# Patient Record
Sex: Female | Born: 1990 | Race: White | Hispanic: No | Marital: Married | Smoking: Never smoker
Health system: Southern US, Community
[De-identification: ages and names within clinical notes are randomized; demographics above are authoritative.]

## PROBLEM LIST (undated history)

## (undated) ENCOUNTER — Inpatient Hospital Stay (HOSPITAL_COMMUNITY): Payer: Self-pay

## (undated) HISTORY — PX: BREAST ENHANCEMENT SURGERY: SHX7

## (undated) HISTORY — PX: WISDOM TOOTH EXTRACTION: SHX21

## (undated) HISTORY — PX: CLAVICLE SURGERY: SHX598

---

## 2016-04-18 ENCOUNTER — Inpatient Hospital Stay (HOSPITAL_COMMUNITY)
Admission: AD | Admit: 2016-04-18 | Discharge: 2016-04-18 | Disposition: A | Discharge status: Home / Self Care | Payer: Medicaid Other | Source: Ambulatory Visit | Attending: Obstetrics and Gynecology | Admitting: Obstetrics and Gynecology

## 2016-04-18 ENCOUNTER — Inpatient Hospital Stay (HOSPITAL_COMMUNITY): Payer: Medicaid Other

## 2016-04-18 ENCOUNTER — Inpatient Hospital Stay (EMERGENCY_DEPARTMENT_HOSPITAL)
Admission: AD | Admit: 2016-04-18 | Discharge: 2016-04-18 | Disposition: A | Discharge status: Home / Self Care | Payer: Medicaid Other | Source: Ambulatory Visit | Attending: Obstetrics and Gynecology | Admitting: Obstetrics and Gynecology

## 2016-04-18 ENCOUNTER — Encounter (HOSPITAL_COMMUNITY): Payer: Self-pay | Admitting: *Deleted

## 2016-04-18 ENCOUNTER — Encounter (HOSPITAL_COMMUNITY): Payer: Self-pay

## 2016-04-18 DIAGNOSIS — O3680X0 Pregnancy with inconclusive fetal viability, not applicable or unspecified: Secondary | ICD-10-CM

## 2016-04-18 DIAGNOSIS — R1013 Epigastric pain: Secondary | ICD-10-CM | POA: Diagnosis present

## 2016-04-18 DIAGNOSIS — R109 Unspecified abdominal pain: Secondary | ICD-10-CM

## 2016-04-18 DIAGNOSIS — O26891 Other specified pregnancy related conditions, first trimester: Secondary | ICD-10-CM | POA: Insufficient documentation

## 2016-04-18 DIAGNOSIS — O209 Hemorrhage in early pregnancy, unspecified: Secondary | ICD-10-CM | POA: Insufficient documentation

## 2016-04-18 DIAGNOSIS — Z3A01 Less than 8 weeks gestation of pregnancy: Secondary | ICD-10-CM | POA: Insufficient documentation

## 2016-04-18 DIAGNOSIS — Z3490 Encounter for supervision of normal pregnancy, unspecified, unspecified trimester: Secondary | ICD-10-CM

## 2016-04-18 DIAGNOSIS — R6881 Early satiety: Secondary | ICD-10-CM | POA: Insufficient documentation

## 2016-04-18 DIAGNOSIS — O9989 Other specified diseases and conditions complicating pregnancy, childbirth and the puerperium: Secondary | ICD-10-CM

## 2016-04-18 DIAGNOSIS — O26899 Other specified pregnancy related conditions, unspecified trimester: Secondary | ICD-10-CM

## 2016-04-18 LAB — URINE MICROSCOPIC-ADD ON

## 2016-04-18 LAB — COMPREHENSIVE METABOLIC PANEL
ALT: 13 U/L — AB (ref 14–54)
AST: 18 U/L (ref 15–41)
Albumin: 4.5 g/dL (ref 3.5–5.0)
Alkaline Phosphatase: 112 U/L (ref 38–126)
Anion gap: 6 (ref 5–15)
BUN: 13 mg/dL (ref 6–20)
CHLORIDE: 105 mmol/L (ref 101–111)
CO2: 24 mmol/L (ref 22–32)
CREATININE: 0.55 mg/dL (ref 0.44–1.00)
Calcium: 9.5 mg/dL (ref 8.9–10.3)
GFR calc non Af Amer: >60 mL/min (ref >60)
Glucose, Bld: 86 mg/dL (ref 65–99)
Potassium: 4.5 mmol/L (ref 3.5–5.1)
SODIUM: 135 mmol/L (ref 135–145)
Total Bilirubin: 1 mg/dL (ref 0.3–1.2)
Total Protein: 7.5 g/dL (ref 6.5–8.1)

## 2016-04-18 LAB — URINALYSIS, ROUTINE W REFLEX MICROSCOPIC
Bilirubin Urine: NEGATIVE
Glucose, UA: NEGATIVE mg/dL
Ketones, ur: NEGATIVE mg/dL
LEUKOCYTES UA: NEGATIVE
NITRITE: NEGATIVE
PROTEIN: NEGATIVE mg/dL
Specific Gravity, Urine: 1.01 (ref 1.005–1.030)
pH: 5.5 (ref 5.0–8.0)

## 2016-04-18 LAB — WET PREP, GENITAL
Clue Cells Wet Prep HPF POC: NONE SEEN
Sperm: NONE SEEN
Trich, Wet Prep: NONE SEEN
YEAST WET PREP: NONE SEEN

## 2016-04-18 LAB — HCG, QUANTITATIVE, PREGNANCY: HCG, BETA CHAIN, QUANT, S: 267 m[IU]/mL — AB (ref <5)

## 2016-04-18 LAB — LIPASE, BLOOD: Lipase: 27 U/L (ref 11–51)

## 2016-04-18 LAB — CBC
HCT: 39 % (ref 36.0–46.0)
HEMOGLOBIN: 13.4 g/dL (ref 12.0–15.0)
MCH: 28.5 pg (ref 26.0–34.0)
MCHC: 34.4 g/dL (ref 30.0–36.0)
MCV: 82.8 fL (ref 78.0–100.0)
Platelets: 208 10*3/uL (ref 150–400)
RBC: 4.71 MIL/uL (ref 3.87–5.11)
RDW: 13.7 % (ref 11.5–15.5)
WBC: 12.4 10*3/uL — AB (ref 4.0–10.5)

## 2016-04-18 LAB — POCT PREGNANCY, URINE: PREG TEST UR: POSITIVE — AB

## 2016-04-18 MED ORDER — GI COCKTAIL ~~LOC~~
30.0000 mL | Freq: Once | ORAL | Status: AC
  Administered 2016-04-18: 30 mL via ORAL
  Filled 2016-04-18: qty 30

## 2016-04-18 MED ORDER — PANTOPRAZOLE SODIUM 40 MG PO TBEC
80.0000 mg | DELAYED_RELEASE_TABLET | Freq: Once | ORAL | Status: AC
  Administered 2016-04-18: 80 mg via ORAL
  Filled 2016-04-18: qty 2

## 2016-04-18 MED ORDER — OMEPRAZOLE 40 MG PO CPDR: 40.0000 mg | DELAYED_RELEASE_CAPSULE | Freq: Two times a day (BID) | ORAL | 0 refills | Status: DC

## 2016-04-18 MED ORDER — ACETAMINOPHEN 500 MG PO TABS
1000.0000 mg | ORAL_TABLET | Freq: Once | ORAL | Status: AC
  Administered 2016-04-18: 1000 mg via ORAL
  Filled 2016-04-18: qty 2

## 2016-04-18 MED ORDER — PROMETHAZINE HCL 25 MG PO TABS
25.0000 mg | ORAL_TABLET | Freq: Once | ORAL | Status: AC
  Administered 2016-04-18: 25 mg via ORAL
  Filled 2016-04-18: qty 1

## 2016-04-18 MED ORDER — FAMOTIDINE 20 MG PO TABS
20.0000 mg | ORAL_TABLET | Freq: Once | ORAL | Status: AC
  Administered 2016-04-18: 20 mg via ORAL
  Filled 2016-04-18: qty 1

## 2016-04-18 MED ORDER — SIMETHICONE 80 MG PO CHEW
80.0000 mg | CHEWABLE_TABLET | Freq: Once | ORAL | Status: AC
  Administered 2016-04-18: 80 mg via ORAL
  Filled 2016-04-18: qty 1

## 2016-04-18 NOTE — Discharge Instructions (Signed)

## 2016-04-18 NOTE — MAU Note (Addendum)
Patient has had positive UPT, onset of lower abdominal pain. Patient denies bleeding or vaginal discharge, does states she had 4 loose stools this morning, having nausea but no vomiting.

## 2016-04-18 NOTE — MAU Note (Signed)
Notified Dr. Omer JackMumaw on unit pain unrelieved by [facility].

## 2016-04-18 NOTE — Discharge Instructions (Signed)
Abdominal Pain During Pregnancy Abdominal pain is common in pregnancy. Most of the time, it does not cause harm. There are many causes of abdominal pain. Some causes are more serious than others. Some of the causes of abdominal pain in pregnancy are easily diagnosed. Occasionally, the diagnosis takes time to understand. Other times, the cause is not determined. Abdominal pain can be a sign that something is very wrong with the pregnancy, or the pain may have nothing to do with the pregnancy at all. For this reason, always tell your health care provider if you have any abdominal discomfort. HOME CARE INSTRUCTIONS  Monitor your abdominal pain for any changes. The following actions may help to alleviate any discomfort you are experiencing:  Do not have sexual intercourse or put anything in your vagina until your symptoms go away completely.  Get plenty of rest until your pain improves.  Drink clear fluids if you feel nauseous. Avoid solid food as long as you are uncomfortable or nauseous.  Only take over-the-counter or prescription medicine as directed by your health care provider.  Keep all follow-up appointments with your health care provider. SEEK IMMEDIATE MEDICAL CARE IF:  You are bleeding, leaking fluid, or passing tissue from the vagina.  You have increasing pain or cramping.  You have persistent vomiting.  You have painful or bloody urination.  You have a fever.  You notice a decrease in your baby's movements.  You have extreme weakness or feel faint.  You have shortness of breath, with or without abdominal pain.  You develop a severe headache with abdominal pain.  You have abnormal vaginal discharge with abdominal pain.  You have persistent diarrhea.  You have abdominal pain that continues even after rest, or gets worse. MAKE SURE YOU:   Understand these instructions.  Will watch your condition.  Will get help right away if you are not doing well or get worse.     This information is not intended to replace advice given to you by your health care provider. Make sure you discuss any questions you have with your health care provider.   Document Released: 06/09/2005 Document Revised: 03/30/2013 Document Reviewed: 01/06/2013 Elsevier Interactive Patient Education 2016 Elsevier Inc.     Gastroesophageal Reflux Disease, Adult Normally, food travels down the esophagus and stays in the stomach to be digested. However, when a person has gastroesophageal reflux disease (GERD), food and stomach acid move back up into the esophagus. When this happens, the esophagus becomes sore and inflamed. Over time, GERD can create small holes (ulcers) in the lining of the esophagus.  CAUSES This condition is caused by a problem with the muscle between the esophagus and the stomach (lower esophageal sphincter, or LES). Normally, the LES muscle closes after food passes through the esophagus to the stomach. When the LES is weakened or abnormal, it does not close properly, and that allows food and stomach acid to go back up into the esophagus. The LES can be weakened by certain dietary substances, medicines, and medical conditions, including:  Tobacco use.  Pregnancy.  Having a hiatal hernia.  Heavy alcohol use.  Certain foods and beverages, such as coffee, chocolate, onions, and peppermint. RISK FACTORS This condition is more likely to develop in:  People who have an increased body weight.  People who have connective tissue disorders.  People who use NSAID medicines. SYMPTOMS Symptoms of this condition include:  Heartburn.  Difficult or painful swallowing.  The feeling of having a lump in the throat.  Abitter taste in the mouth.  Bad breath.  Having a large amount of saliva.  Having an upset or bloated stomach.  Belching.  Chest pain.  Shortness of breath or wheezing.  Ongoing (chronic) cough or a night-time cough.  Wearing away of tooth  enamel.  Weight loss. Different conditions can cause chest pain. Make sure to see your health care provider if you experience chest pain. DIAGNOSIS Your health care provider will take a medical history and perform a physical exam. To determine if you have mild or severe GERD, your health care provider may also monitor how you respond to treatment. You may also have other tests, including:  An endoscopy toexamine your stomach and esophagus with a small camera.  A test thatmeasures the acidity level in your esophagus.  A test thatmeasures how much pressure is on your esophagus.  A barium swallow or modified barium swallow to show the shape, size, and functioning of your esophagus. TREATMENT The goal of treatment is to help relieve your symptoms and to prevent complications. Treatment for this condition may vary depending on how severe your symptoms are. Your health care provider may recommend:  Changes to your diet.  Medicine.  Surgery. HOME CARE INSTRUCTIONS Diet  Follow a diet as recommended by your health care provider. This may involve avoiding foods and drinks such as:  Coffee and tea (with or without caffeine).  Drinks that containalcohol.  Energy drinks and sports drinks.  Carbonated drinks or sodas.  Chocolate and cocoa.  Peppermint and mint flavorings.  Garlic and onions.  Horseradish.  Spicy and acidic foods, including peppers, chili powder, curry powder, vinegar, hot sauces, and barbecue sauce.  Citrus fruit juices and citrus fruits, such as oranges, lemons, and limes.  Tomato-based foods, such as red sauce, chili, salsa, and pizza with red sauce.  Fried and fatty foods, such as donuts, french fries, potato chips, and high-fat dressings.  High-fat meats, such as hot dogs and fatty cuts of red and white meats, such as rib eye steak, sausage, ham, and bacon.  High-fat dairy items, such as whole milk, butter, and cream cheese.  Eat small, frequent  meals instead of large meals.  Avoid drinking large amounts of liquid with your meals.  Avoid eating meals during the 2-3 hours before bedtime.  Avoid lying down right after you eat.  Do not exercise right after you eat. General Instructions  Pay attention to any changes in your symptoms.  Take over-the-counter and prescription medicines only as told by your health care provider. Do not take aspirin, ibuprofen, or other NSAIDs unless your health care provider told you to do so.  Do not use any tobacco products, including cigarettes, chewing tobacco, and e-cigarettes. If you need help quitting, ask your health care provider.  Wear loose-fitting clothing. Do not wear anything tight around your waist that causes pressure on your abdomen.  Raise (elevate) the head of your bed 6 inches (15cm).  Try to reduce your stress, such as with yoga or meditation. If you need help reducing stress, ask your health care provider.  If you are overweight, reduce your weight to an amount that is healthy for you. Ask your health care provider for guidance about a safe weight loss goal.  Keep all follow-up visits as told by your health care provider. This is important. SEEK MEDICAL CARE IF:  You have new symptoms.  You have unexplained weight loss.  You have difficulty swallowing, or it hurts to swallow.  You have  wheezing or a persistent cough.  Your symptoms do not improve with treatment.  You have a hoarse voice. SEEK IMMEDIATE MEDICAL CARE IF:  You have pain in your arms, neck, jaw, teeth, or back.  You feel sweaty, dizzy, or light-headed.  You have chest pain or shortness of breath.  You vomit and your vomit looks like blood or coffee grounds.  You faint.  Your stool is bloody or black.  You cannot swallow, drink, or eat.   This information is not intended to replace advice given to you by your health care provider. Make sure you discuss any questions you have with your health  care provider.   Document Released: 03/19/2005 Document Revised: 02/28/2015 Document Reviewed: 10/04/2014 Elsevier Interactive Patient Education [employer]2016 Elsevier Inc.

## 2016-04-18 NOTE — MAU Note (Signed)
Was seen here this am for upper abd pain which I think was probably gas. This afternoon started seeing pink vag d/c and having some lower abd cramping. Did see dime-sized something in bottom of toilet that was red on one occ.

## 2016-04-18 NOTE — MAU Provider Note (Signed)
Chief Complaint: Abdominal Pain and Vaginal Bleeding   First Provider Initiated Contact with Patient 04/19/2016 at 2130.  SUBJECTIVE HPI: Toni Brown is a 25 y.o. G3P1001 at [redacted]w[redacted]d by LMP who presents to maternity admissions reporting vaginal spotting and mild lower abdominal cramping in early pregnancy. She was seen in MAU earlier today for epigastric pain which resolved after she left MAU. She reports she ate food and passed some gas and felt much better so believes the pain earlier was gas pain.  She took a nap this afternoon and when she woke up and went to the bathroom, she saw some small clots in the toilet and pink when she wiped. She has noticed mild menstrual-like cramping that is associated with the bleeding.  She has not tried any treatments, nothing makes the symptom better or worse. It is new, as she has not had any bleeding in the pregnancy so far.   She denies vaginal itching/burning, urinary symptoms, h/a, dizziness, n/v, or fever/chills.     HPI  History reviewed. No pertinent past medical history. Past Surgical History:  Procedure Laterality Date  . BREAST ENHANCEMENT SURGERY    . CLAVICLE SURGERY    . WISDOM TOOTH EXTRACTION     Social History   Social History  . Marital status: Married    Spouse name: N/A  . Number of children: N/A  . Years of education: N/A   Occupational History  . Not on file.   Social History Main Topics  . Smoking status: Never Smoker  . Smokeless tobacco: Never Used  . Alcohol use No  . Drug use: No  . Sexual activity: Yes   Other Topics Concern  . Not on file   Social History Narrative  . No narrative on file   No current facility-administered medications on file prior to encounter.    Current Outpatient Prescriptions on File Prior to Encounter  Medication Sig Dispense Refill  . omeprazole (PRILOSEC) 40 MG capsule Take 1 capsule (40 mg total) by mouth 2 (two) times daily. 30 capsule 0  . Prenatal Vit-Fe Fumarate-FA (PRENATAL  MULTIVITAMIN) TABS tablet Take 1 tablet by mouth daily.      No Known Allergies  ROS:  Review of Systems  Constitutional: Negative for chills, fatigue and fever.  Respiratory: Negative for shortness of breath.   Cardiovascular: Negative for chest pain.  Gastrointestinal: Positive for nausea. Negative for vomiting.  Genitourinary: Positive for pelvic pain and vaginal bleeding. Negative for difficulty urinating, dysuria, flank pain, vaginal discharge and vaginal pain.  Neurological: Negative for dizziness and headaches.  Psychiatric/Behavioral: Negative.      I have reviewed patient's Past Medical Hx, Surgical Hx, Family Hx, Social Hx, medications and allergies.   Physical Exam   Patient Vitals for the past 24 hrs:  BP Temp Pulse Resp Height Weight  04/18/16 2247 110/64 98.1 F (36.7 C) 83 18 - -  04/18/16 1950 103/71 98.5 F (36.9 C) 90 18 5\' 10"  (1.778 m) 68.9 kg (151 lb 12.8 oz)   Constitutional: Well-developed, well-nourished female in no acute distress.  Cardiovascular: normal rate Respiratory: normal effort GI: Abd soft, non-tender. Pos BS x 4 MS: Extremities nontender, no edema, normal ROM Neurologic: Alert and oriented x 4.  GU: Neg CVAT.  PELVIC EXAM: Cervix pink, visually closed, without lesion, white creamy discharge with minimal blood, vaginal walls and external genitalia normal Bimanual exam: Cervix 0/long/high, firm, anterior, neg CMT, uterus nontender, nonenlarged, adnexa without tenderness, enlargement, or mass  LAB RESULTS Results  for orders placed or performed during the hospital encounter of 04/18/16 (from the past 24 hour(s))  ABO/Rh     Status: None (Preliminary result)   Collection Time: 04/18/16  8:48 PM  Result Value Ref Range   ABO/RH(D) O NEG   Wet prep, genital     Status: Abnormal   Collection Time: 04/18/16 10:40 PM  Result Value Ref Range   Yeast Wet Prep HPF POC NONE SEEN NONE SEEN   Trich, Wet Prep NONE SEEN NONE SEEN   Clue Cells Wet  Prep HPF POC NONE SEEN NONE SEEN   WBC, Wet Prep HPF POC MODERATE (A) NONE SEEN   Sperm NONE SEEN     --/--/O NEG (10/27 2048)  IMAGING [place] Ob Comp Less 14 Wks  Result Date: 04/18/2016 CLINICAL DATA:  Epigastric painSpotting with abdominal pain EXAM: OBSTETRIC <14 WK US AND TRANSVAGINAL OB US TECHNIQUE: Both transabdominal and transvaginal ultrasound examinations were performed for complete evaluation of the gestation as well as the maternal uterus, adnexal regions, and pelvic cul-de-sac. Transvaginal technique was performed to assess early pregnancy. COMPARISON:  None. FINDINGS: Intrauterine gestational sac: A small anechoic fluid collection is visualized within the thickened endometrium, this likely represents a small gestational sac. Yolk sac:  Not visualized Embryo:  Not visualized Cardiac Activity: Not visualized MSD: 3.6  mm   5 w   0  d Subchorionic hemorrhage:  None visualized. Maternal uterus/adnexae: The uterus is grossly unremarkable. Bilateral ovaries are normal in echotexture. The right ovary measures 1.9 x 2.8 x 1.9 cm. The left ovary measures 1.6 x 2.9 x 1.4 cm. No free fluid. IMPRESSION: Endometrial thickening with small anechoic fluid collection in the thickened endometrium. Probable early intrauterine gestational sac, but no yolk sac, fetal pole, or cardiac activity yet visualized. Recommend follow-up quantitative B-HCG levels and follow-up US in 14 days to confirm and assess viability. This recommendation follows SRU consensus guidelines: Diagnostic Criteria for Nonviable Pregnancy Early in the First Trimester. Malva Limes Engl J Med 2013; 161:0960-45; 369:1443-51. Electronically Signed   By: Jasmine PangKim  Fujinaga M.D.   On: 04/18/2016 21:25   [place] Ob Transvaginal  Result Date: 04/18/2016 CLINICAL DATA:  Epigastric painSpotting with abdominal pain EXAM: OBSTETRIC <14 WK US AND TRANSVAGINAL OB US TECHNIQUE: Both transabdominal and transvaginal ultrasound examinations were performed for complete evaluation of the  gestation as well as the maternal uterus, adnexal regions, and pelvic cul-de-sac. Transvaginal technique was performed to assess early pregnancy. COMPARISON:  None. FINDINGS: Intrauterine gestational sac: A small anechoic fluid collection is visualized within the thickened endometrium, this likely represents a small gestational sac. Yolk sac:  Not visualized Embryo:  Not visualized Cardiac Activity: Not visualized MSD: 3.6  mm   5 w   0  d Subchorionic hemorrhage:  None visualized. Maternal uterus/adnexae: The uterus is grossly unremarkable. Bilateral ovaries are normal in echotexture. The right ovary measures 1.9 x 2.8 x 1.9 cm. The left ovary measures 1.6 x 2.9 x 1.4 cm. No free fluid. IMPRESSION: Endometrial thickening with small anechoic fluid collection in the thickened endometrium. Probable early intrauterine gestational sac, but no yolk sac, fetal pole, or cardiac activity yet visualized. Recommend follow-up quantitative B-HCG levels and follow-up US in 14 days to confirm and assess viability. This recommendation follows SRU consensus guidelines: Diagnostic Criteria for Nonviable Pregnancy Early in the First Trimester. Malva Limes Engl J Med 2013; 409:8119-14; 369:1443-51. Electronically Signed   By: Jasmine PangKim  Fujinaga M.D.   On: 04/18/2016 21:25   [place] Abdomen Limited Ruq  Result  Date: 04/18/2016 CLINICAL DATA:  Epigastric pain. Patient is pregnant. LMP 03/07/2016. 6 weeks 3 days. EXAM: US ABDOMEN LIMITED - RIGHT UPPER QUADRANT COMPARISON:  None FINDINGS: Gallbladder: Insert gallbladder 1.1 mm Common bile duct: Diameter: 2.7 mm Liver: No focal lesion identified. Within normal limits in parenchymal echogenicity. IMPRESSION: Normal evaluation of the right upper quadrant. Electronically Signed   By: Norva Pavlov M.D.   On: 04/18/2016 15:26    MAU Management/MDM: Ordered labs and reviewed results. Pt stable at time of discharge.  ASSESSMENT 1. Pregnancy of unknown anatomic location   2. Vaginal bleeding in pregnancy, first  trimester     PLAN Discharge home Follow up in 2 days for repeat quant hcg Return precautions given Patient verbalized agreement and understanding with plan   Medication List    TAKE these medications   omeprazole 40 MG capsule Commonly known as:  PRILOSEC Take 1 capsule (40 mg total) by mouth 2 (two) times daily.   prenatal multivitamin Tabs tablet Take 1 tablet by mouth daily.      Follow-up Information    [facility]  MATERNITY ADMISSIONS Follow up on 04/20/2016.   Contact information: [address] [id number] mc [place] [address] [phone]         Dolores Patty, DO PGY-1, [facility] 04/18/2016 11:20 PM  I was present for the exam and agree with above. Ectopic precautions.   [place], CNM 04/19/2016 8:46 AM

## 2016-04-18 NOTE — MAU Provider Note (Signed)
Chief Complaint: Abdominal Pain   SUBJECTIVE HPI: Toni Brown is a 25 y.o. G3P1001 at [redacted]w[redacted]d who presents to Maternity Admissions reporting epigastric pain.  Patient states pain started today. Located in the epigastric region, does not radiate anywhere. Sharp, burning. Some nausea, no vomiting. Able to eat breakfast this AM. Never had this before, rates it at a 6/10. Hurts to bend over or twist. Nothing makes it better, has not tried any medications. Previous pregnancy had "terrible heartburn".  Had a few loose stools this morning, no hematochezia or melena. Denies F/C, CP/SOB. Denies pelvic pain, VB.    No past medical history on file. OB History  Gravida Para Term Preterm AB Living  3 1 1     1   SAB TAB Ectopic Multiple Live Births          1    # Outcome Date GA Lbr Len/2nd Weight Sex Delivery Anes PTL Lv  3 Current           2 Gravida           1 Term              Past Surgical History:  Procedure Laterality Date  . BREAST ENHANCEMENT SURGERY    . CLAVICLE SURGERY    . WISDOM TOOTH EXTRACTION     Social History   Social History  . Marital status: Married    Spouse name: N/A  . Number of children: N/A  . Years of education: N/A   Occupational History  . Not on file.   Social History Main Topics  . Smoking status: Never Smoker  . Smokeless tobacco: Never Used  . Alcohol use No  . Drug use: No  . Sexual activity: Not on file   Other Topics Concern  . Not on file   Social History Narrative  . No narrative on file   No current facility-administered medications on file prior to encounter.    No current outpatient prescriptions on file prior to encounter.   Allergies not on file  I have reviewed the past Medical Hx, Surgical Hx, Social Hx, Allergies and Medications.   REVIEW OF SYSTEMS  RESPIRATORY: no cough, shortness of breath, or wheezing CARDIOVASCULAR: no chest pain or dyspnea on exertion GASTROINTESTINAL: +epigastric abdominal pain, no change in  bowel habits, or black or bloody stools GENITO-URINARY: no dysuria, trouble voiding, or hematuria negative for - genital discharge, vulvar/vaginal symptoms or vaginal bleeding MUSKULOSKELETAL: negative for - gait disturbance or swelling in ankle - bilateral, foot - bilateral and leg - bilateral NEUROLOGICAL: negative for - dizziness, gait disturbance, headaches, numbness/tingling or visual changes OBSTETRICAL: No vaginal bleeding, no leaking fluid. No vaginal discharge.  OBJECTIVE Patient Vitals for the past 24 hrs:  BP Temp Temp src Pulse Resp Height Weight  04/18/16 1001 122/76 98.1 F (36.7 C) Oral 80 16 - -  04/18/16 0954 - - - - - 5\' 10"  (1.778 m) 153 lb (69.4 kg)    PHYSICAL EXAM Constitutional: Well-developed, well-nourished female in no acute distress.  Cardiovascular: normal rate, rhythm. No murmurs Respiratory: normal rate and effort, CTAB. GI: Abd soft, tender to epigastric region on palpation, gravid appropriate for gestational age. Pos BS x 4 MS: Extremities nontender, no edema, normal ROM Neurologic: Alert and oriented x 4.  GU: Neg CVAT.    LAB RESULTS Results for orders placed or performed during the hospital encounter of 04/18/16 (from the past 24 hour(s))  Urinalysis, Routine w reflex microscopic (not at [facility])  Status: Abnormal   Collection Time: 04/18/16  9:52 AM  Result Value Ref Range   Color, Urine YELLOW YELLOW   APPearance CLEAR CLEAR   Specific Gravity, Urine 1.010 1.005 - 1.030   pH 5.5 5.0 - 8.0   Glucose, UA NEGATIVE NEGATIVE mg/dL   Hgb urine dipstick TRACE (A) NEGATIVE   Bilirubin Urine NEGATIVE NEGATIVE   Ketones, ur NEGATIVE NEGATIVE mg/dL   Protein, ur NEGATIVE NEGATIVE mg/dL   Nitrite NEGATIVE NEGATIVE   Leukocytes, UA NEGATIVE NEGATIVE  Urine microscopic-add on     Status: Abnormal   Collection Time: 04/18/16  9:52 AM  Result Value Ref Range   Squamous Epithelial / LPF 0-5 (A) NONE SEEN   WBC, UA 0-5 0 - 5 WBC/hpf   RBC / HPF 0-5 0 -  5 RBC/hpf   Bacteria, UA FEW (A) NONE SEEN  Pregnancy, urine POC     Status: Abnormal   Collection Time: 04/18/16 10:00 AM  Result Value Ref Range   Preg Test, Ur POSITIVE (A) NEGATIVE    IMAGING No results found.  MAU COURSE GI cocktail - no relief H2 blocker, PPI Labs- WNL  [facility] Dr. Clayborne DanaMesner, who agrees with workup and if no improvement in symptoms and workup is negative, OK to d/c home and if worsens to [facility].    MDM Plan of care reviewed with patient, including labs and tests ordered and medical treatment.   ASSESSMENT 1. Abdominal pain, epigastric   2. Abdominal pain affecting pregnancy   3. Epigastric pain   4. Early stage of pregnancy     PLAN Discharge home in stable condition. PPI to be taken BID for the next few days. Abdominal pain and bleeding precautions given.      Medication List    STOP taking these medications   ibuprofen 200 MG tablet Commonly known as:  ADVIL,MOTRIN     TAKE these medications   omeprazole 40 MG capsule Commonly known as:  PRILOSEC Take 1 capsule (40 mg total) by mouth 2 (two) times daily.   prenatal multivitamin Tabs tablet Take 1 tablet by mouth daily.        [address] [place], [place] 04/18/2016  10:41 AM

## 2016-04-19 ENCOUNTER — Encounter (HOSPITAL_COMMUNITY): Payer: Self-pay | Admitting: Certified Nurse Midwife

## 2016-04-19 ENCOUNTER — Inpatient Hospital Stay (HOSPITAL_COMMUNITY)
Admission: AD | Admit: 2016-04-19 | Discharge: 2016-04-19 | Disposition: A | Discharge status: Home / Self Care | Payer: Managed Care, Other (non HMO) | Source: Ambulatory Visit | Attending: Obstetrics & Gynecology | Admitting: Obstetrics & Gynecology

## 2016-04-19 ENCOUNTER — Inpatient Hospital Stay (HOSPITAL_COMMUNITY): Payer: Managed Care, Other (non HMO)

## 2016-04-19 DIAGNOSIS — Z6741 Type O blood, Rh negative: Secondary | ICD-10-CM | POA: Diagnosis not present

## 2016-04-19 DIAGNOSIS — O039 Complete or unspecified spontaneous abortion without complication: Secondary | ICD-10-CM

## 2016-04-19 DIAGNOSIS — N939 Abnormal uterine and vaginal bleeding, unspecified: Secondary | ICD-10-CM | POA: Diagnosis present

## 2016-04-19 DIAGNOSIS — O209 Hemorrhage in early pregnancy, unspecified: Secondary | ICD-10-CM

## 2016-04-19 LAB — HCG, QUANTITATIVE, PREGNANCY: HCG, BETA CHAIN, QUANT, S: 164 m[IU]/mL — AB (ref <5)

## 2016-04-19 LAB — ABO/RH: ABO/RH(D): O NEG

## 2016-04-19 MED ORDER — RHO D IMMUNE GLOBULIN 1500 UNIT/2ML IJ SOSY
300.0000 ug | PREFILLED_SYRINGE | Freq: Once | INTRAMUSCULAR | Status: AC
  Administered 2016-04-19: 300 ug via INTRAMUSCULAR
  Filled 2016-04-19: qty 2

## 2016-04-19 NOTE — MAU Provider Note (Signed)
History     CSN: [id number]  Arrival date and time: 04/19/16 [id number]   First Provider Initiated Contact with Patient 04/19/16 1008      Chief Complaint  Patient presents with  . Vaginal Bleeding   HPI Toni Brown 25 y.o. [id number]  Comes to MAU with worsening cramping and heavier bleeding than at her visit on yesterday.  Chart and previous notes reviewed.  Also passed some tissue at home and strongly suspects this has been a miscarriage.  Showed provider picture she had on her phone of the tissue she passed.  Has had a miscarriage previously and thinks this is a miscarriage also.    OB History    Gravida Para Term Preterm AB Living   3 1 1   1 1    SAB TAB Ectopic Multiple Live Births   1       1      History reviewed. No pertinent past medical history.  Past Surgical History:  Procedure Laterality Date  . BREAST ENHANCEMENT SURGERY    . CLAVICLE SURGERY    . WISDOM TOOTH EXTRACTION      History reviewed. No pertinent family history.  Social History  Substance Use Topics  . Smoking status: Never Smoker  . Smokeless tobacco: Never Used  . Alcohol use No    Allergies: No Known Allergies  Prescriptions Prior to Admission  Medication Sig Dispense Refill Last Dose  . omeprazole (PRILOSEC) 40 MG capsule Take 1 capsule (40 mg total) by mouth 2 (two) times daily. 30 capsule 0 04/18/2016  . Prenatal Vit-Fe Fumarate-FA (PRENATAL MULTIVITAMIN) TABS tablet Take 1 tablet by mouth daily.    04/18/2016    Review of Systems  Constitutional: Negative for fever.  Gastrointestinal: Positive for abdominal pain.  Genitourinary:       No vaginal discharge. Heavier vaginal bleeding and passing of tissue. No dysuria.   Physical Exam   Blood pressure 113/74, pulse 82, temperature 98.5 F (36.9 C), temperature source Oral, resp. rate 18, last menstrual period 03/07/2016.  Physical Exam  Nursing note and vitals reviewed. Constitutional: She is oriented to person, place, and time. She  appears well-developed and well-nourished.  HENT:  Head: Normocephalic.  Eyes: EOM are normal.  Neck: Neck supple.  GI: Soft. There is no tenderness.  Genitourinary:  Genitourinary Comments: Speculum exam: Vagina - Moderate amount of dark blood and clots Cervix - Active bleeding noted and clot in cervix - removed with forceps and tolerated very well by client. Bimanual exam: Cervix slightly open - less than 1 cm. Uterus non tender, normal size Adnexa non tender, no masses bilaterally Chaperone present for exam.   Musculoskeletal: Normal range of motion.  Neurological: She is alert and oriented to person, place, and time.  Skin: Skin is warm and dry.  Psychiatric: She has a normal mood and affect.    MAU Course  Procedures Results for orders placed or performed during the hospital encounter of 04/19/16 (from the past 24 hour(s))  hCG, quantitative, pregnancy     Status: Abnormal   Collection Time: 04/19/16 10:27 AM  Result Value Ref Range   hCG, Beta Chain, Quant, S 164 (H) <5 mIU/mL  Rh IG workup (includes ABO/Rh)     Status: None (Preliminary result)   Collection Time: 04/19/16 10:27 AM  Result Value Ref Range   Gestational Age(Wks) 6    ABO/RH(D) O NEG    Antibody Screen NEG    Fetal Screen NOT NEEDED    Unit  Number [id number]    Blood Component Type RHIG    Unit division 00    Status of Unit ISSUED    Transfusion Status OK TO TRANSFUSE    CLINICAL DATA:  25 year old pregnant female presents with heavy bleeding. Tiny intrauterine sac-like structure was seen on obstetric scan from 1 day prior. Quantitative beta HCG 267 one day prior.  EDC by LMP: 11/29/2016, projecting to an expected gestational age of [redacted] weeks 0 days  EXAM: TRANSVAGINAL OB ULTRASOUND  TECHNIQUE: Transvaginal ultrasound was performed for complete evaluation of the gestation as well as the maternal uterus, adnexal regions, and pelvic cul-de-sac.  COMPARISON:  04/18/2016 obstetric  scan.  FINDINGS: Uterus is anteverted and normal in size and configuration. No uterine fibroids or other myometrial abnormalities. Bilayer endometrial thickness 10 mm. No intrauterine gestational sac is demonstrated. The previously demonstrated 5 x 2 x 4 mm sac-like structure in the fundal endometrium on the 04/18/2016 scan is absent on today's scan. Endometrium is mildly heterogeneous with no focal endometrial mass demonstrated.  Right ovary measures 3.3 x 1.9 x 2.3 cm. Left ovary measures 2.5 x 1.2 x 1.9 cm. No suspicious ovarian or adnexal masses. No abnormal free fluid in the pelvis.  IMPRESSION: No intrauterine gestational sac. Previously demonstrated 5 x 2 x 4 mm sac-like structure in the fundal endometrium on the 04/18/2016 scan is absent on this scan. Mildly heterogeneous endometrium measuring 10 mm bilayer thickness. No suspicious ovarian or adnexal masses. Findings are most consistent with a spontaneous abortion in progress. Given that a definitive pregnancy has not been visualized by sonography, serial serum beta HCG level monitoring is recommended, and a follow-up obstetric scan could be obtained if clinically warranted.   MDM Discussed Rhogam with client and she does want this injection.    Assessment and Plan  Miscarriage with O negative blood type  Plan Scheduled an appointment for follow up for BHCG in the clinic on Wednesday, Nov. 8 at 11 am.  Client made note of the appointment.   Rhogam ordered and given in MAU. Advised pelvic rest - no sex, no douching, no tampons.  Ashleynicole Mcclees L Geanie Pacifico 04/19/2016, 10:19 AM

## 2016-04-19 NOTE — Discharge Instructions (Signed)
Keep your appointment in the Clinic at [facility] on Wednesday, November 8 at 11 am for blood work.  Expect to be at the clinic for 2 hours. Can take Ibuprofen by the package directions if needed for pain.

## 2016-04-19 NOTE — MAU Note (Signed)
Pt states she started bleeding heavily this AM and passed what she believes is the POC. Pt came this AM to "be checked out". Pt states she is having increased cramping.

## 2016-04-20 LAB — RH IG WORKUP (INCLUDES ABO/RH)
ABO/RH(D): O NEG
ANTIBODY SCREEN: NEGATIVE
Gestational Age(Wks): 6
UNIT DIVISION: 0

## 2016-04-21 LAB — GC/CHLAMYDIA PROBE AMP (~~LOC~~) NOT AT ARMC
CHLAMYDIA, DNA PROBE: NEGATIVE
NEISSERIA GONORRHEA: NEGATIVE

## 2016-04-30 ENCOUNTER — Telehealth: Payer: Self-pay | Admitting: General Practice

## 2016-04-30 ENCOUNTER — Encounter: Payer: Self-pay | Admitting: General Practice

## 2016-04-30 ENCOUNTER — Ambulatory Visit: Payer: Medicaid Other

## 2016-04-30 NOTE — Telephone Encounter (Signed)
Patient missed lab appt for today. Called patient, no answer- left message stating we are trying to reach you because you missed your lab appt with us today. Please give the front office staff a call so we can reschedule this appt. Will send letter

## 2017-01-31 ENCOUNTER — Inpatient Hospital Stay (HOSPITAL_COMMUNITY)
Admission: AD | Admit: 2017-01-31 | Discharge: 2017-01-31 | Disposition: A | Discharge status: Home / Self Care | Payer: Medicaid Other | Source: Ambulatory Visit | Attending: Family Medicine | Admitting: Family Medicine

## 2017-01-31 ENCOUNTER — Encounter (HOSPITAL_COMMUNITY): Payer: Self-pay | Admitting: *Deleted

## 2017-01-31 DIAGNOSIS — O9989 Other specified diseases and conditions complicating pregnancy, childbirth and the puerperium: Secondary | ICD-10-CM | POA: Insufficient documentation

## 2017-01-31 DIAGNOSIS — R197 Diarrhea, unspecified: Secondary | ICD-10-CM | POA: Diagnosis not present

## 2017-01-31 DIAGNOSIS — O219 Vomiting of pregnancy, unspecified: Secondary | ICD-10-CM | POA: Diagnosis not present

## 2017-01-31 DIAGNOSIS — R112 Nausea with vomiting, unspecified: Secondary | ICD-10-CM | POA: Diagnosis present

## 2017-01-31 DIAGNOSIS — Z3A10 10 weeks gestation of pregnancy: Secondary | ICD-10-CM | POA: Diagnosis not present

## 2017-01-31 LAB — URINALYSIS, ROUTINE W REFLEX MICROSCOPIC
BILIRUBIN URINE: NEGATIVE
Glucose, UA: NEGATIVE mg/dL
KETONES UR: NEGATIVE mg/dL
NITRITE: NEGATIVE
PROTEIN: NEGATIVE mg/dL
Specific Gravity, Urine: 1.014 (ref 1.005–1.030)
pH: 6 (ref 5.0–8.0)

## 2017-01-31 LAB — POCT PREGNANCY, URINE: PREG TEST UR: POSITIVE — AB

## 2017-01-31 MED ORDER — PROMETHAZINE HCL 25 MG PO TABS: 25.0000 mg | ORAL_TABLET | Freq: Four times a day (QID) | ORAL | 0 refills | Status: AC | PRN

## 2017-01-31 MED ORDER — PROMETHAZINE HCL 25 MG PO TABS
25.0000 mg | ORAL_TABLET | Freq: Four times a day (QID) | ORAL | Status: DC | PRN
Start: 2017-01-31 — End: 2017-01-31
  Administered 2017-01-31: 25 mg via ORAL
  Filled 2017-01-31: qty 1

## 2017-01-31 NOTE — MAU Note (Signed)
Has been really really sick with this preg.  Thinks she is getting dehydrated. Has not been seen or had confirmation yet.

## 2017-01-31 NOTE — Discharge Instructions (Signed)
Morning Sickness °Morning sickness is when you feel sick to your stomach (nauseous) during pregnancy. This nauseous feeling may or may not come with vomiting. It often occurs in the morning but can be a problem any time of day. Morning sickness is most common during the first trimester, but it may continue throughout pregnancy. While morning sickness is unpleasant, it is usually harmless unless you develop severe and continual vomiting (hyperemesis gravidarum). This condition requires more intense treatment. °What are the causes? °The cause of morning sickness is not completely known but seems to be related to normal hormonal changes that occur in pregnancy. °What increases the risk? °You are at greater risk if you: °· Experienced nausea or vomiting before your pregnancy. °· Had morning sickness during a previous pregnancy. °· Are pregnant with more than one baby, such as twins. ° °How is this treated? °Do not use any medicines (prescription, over-the-counter, or herbal) for morning sickness without first talking to your health care provider. Your health care provider may prescribe or recommend: °· Vitamin B6 supplements. °· Anti-nausea medicines. °· The herbal medicine ginger. ° °Follow these instructions at home: °· Only take over-the-counter or prescription medicines as directed by your health care provider. °· Taking multivitamins before getting pregnant can prevent or decrease the severity of morning sickness in most women. °· Eat a piece of dry toast or unsalted crackers before getting out of bed in the morning. °· Eat five or six small meals a day. °· Eat dry and bland foods (rice, baked potato). Foods high in carbohydrates are often helpful. °· Do not drink liquids with your meals. Drink liquids between meals. °· Avoid greasy, fatty, and spicy foods. °· Get someone to cook for you if the smell of any food causes nausea and vomiting. °· If you feel nauseous after taking prenatal vitamins, take the vitamins at  night or with a snack. °· Snack on protein foods (nuts, yogurt, cheese) between meals if you are hungry. °· Eat unsweetened gelatins for desserts. °· Wearing an acupressure wristband (worn for sea sickness) may be helpful. °· Acupuncture may be helpful. °· Do not smoke. °· Get a humidifier to keep the air in your house free of odors. °· Get plenty of fresh air. °Contact a health care provider if: °· Your home remedies are not working, and you need medicine. °· You feel dizzy or lightheaded. °· You are losing weight. °Get help right away if: °· You have persistent and uncontrolled nausea and vomiting. °· You pass out (faint). °This information is not intended to replace advice given to you by your health care provider. Make sure you discuss any questions you have with your health care provider. °Document Released: 07/31/2006 Document Revised: 11/15/2015 Document Reviewed: 11/24/2012 °Elsevier Interactive Patient Education © 2017 [employer] ° °

## 2017-01-31 NOTE — MAU Provider Note (Signed)
History     CSN: [id number]  Arrival date and time: 01/31/17 4098  First Provider Initiated Contact with Patient 01/31/17 0804     Chief Complaint  Patient presents with  . Nausea  . Emesis  . Possible Pregnancy   [id number] @10 .3 wks here with diarrhea and "feels dehydrated". She reports frequent nausea and occasional vomiting from the beginning of her pregnancy which she is controlling with Unisom and B6 regimen. Yesterday she drank Pedialyte all day to maintain hydration then had diarrhea multiple times through the night. No sick contacts. No fevers. No recent vomiting but feels nauseated if she is late on her dose of Unisom. Has not started care yet but is planning to at [facility] in [place].    OB History    Gravida Para Term Preterm AB Living   4 1 1   1 1    SAB TAB Ectopic Multiple Live Births   1       1      History reviewed. No pertinent past medical history.  Past Surgical History:  Procedure Laterality Date  . BREAST ENHANCEMENT SURGERY    . CLAVICLE SURGERY    . WISDOM TOOTH EXTRACTION      History reviewed. No pertinent family history.  Social History  Substance Use Topics  . Smoking status: Never Smoker  . Smokeless tobacco: Never Used  . Alcohol use No    Allergies: No Known Allergies  Prescriptions Prior to Admission  Medication Sig Dispense Refill Last Dose  . omeprazole (PRILOSEC) 40 MG capsule Take 1 capsule (40 mg total) by mouth 2 (two) times daily. 30 capsule 0 04/18/2016  . Prenatal Vit-Fe Fumarate-FA (PRENATAL MULTIVITAMIN) TABS tablet Take 1 tablet by mouth daily.    04/18/2016    Review of Systems  Constitutional: Negative for fever.  Gastrointestinal: Positive for diarrhea and nausea. Negative for abdominal pain.  Genitourinary: Negative for vaginal bleeding.  Musculoskeletal: Positive for back pain (occasional).   Physical Exam   Blood pressure 134/71, pulse 92, temperature 98.3 F (36.8 C), temperature source  Oral, resp. rate 16, weight 165 lb 8 oz (75.1 kg), last menstrual period 11/19/2016, unknown if currently breastfeeding.  Physical Exam  Constitutional: She is oriented to person, place, and time. She appears well-developed and well-nourished. No distress (appears comfortable).  HENT:  Head: Normocephalic and atraumatic.  Neck: Normal range of motion.  Respiratory: Effort normal. No respiratory distress.  GI: Soft. She exhibits no distension and no mass. There is no tenderness. There is no rebound and no guarding.  Musculoskeletal: Normal range of motion.       Cervical back: Normal. She exhibits no tenderness.       Thoracic back: Normal. She exhibits no tenderness.       Lumbar back: Normal. She exhibits no tenderness.  Neurological: She is alert and oriented to person, place, and time.  Skin: Skin is warm and dry.  Psychiatric: She has a normal mood and affect.  FHT: 175 bpm  Results for orders placed or performed during the hospital encounter of 01/31/17 (from the past 24 hour(s))  Urinalysis, Routine w reflex microscopic     Status: Abnormal   Collection Time: 01/31/17  7:37 AM  Result Value Ref Range   Color, Urine YELLOW YELLOW   APPearance CLEAR CLEAR   Specific Gravity, Urine 1.014 1.005 - 1.030   pH 6.0 5.0 - 8.0   Glucose, UA NEGATIVE NEGATIVE mg/dL   Hgb urine dipstick  SMALL (A) NEGATIVE   Bilirubin Urine NEGATIVE NEGATIVE   Ketones, ur NEGATIVE NEGATIVE mg/dL   Protein, ur NEGATIVE NEGATIVE mg/dL   Nitrite NEGATIVE NEGATIVE   Leukocytes, UA TRACE (A) NEGATIVE   RBC / HPF 0-5 0 - 5 RBC/hpf   WBC, UA 0-5 0 - 5 WBC/hpf   Bacteria, UA RARE (A) NONE SEEN   Squamous Epithelial / LPF 0-5 (A) NONE SEEN   Mucous PRESENT   Pregnancy, urine POC     Status: Abnormal   Collection Time: 01/31/17  7:45 AM  Result Value Ref Range   Preg Test, Ur POSITIVE (A) NEGATIVE   MAU Course  Procedures Phenergan  MDM Labs ordered and reviewed. No significant dehydration based on UA  and pt appears well. No episodes of emesis. Nausea improved. Tolerating po w/o problem. Recommend mostly water for hydration and occasional Gatorade or Pedialyte. Change B6 to 50 mg TID and hs. Stable for discharge home.   Assessment and Plan   1. [redacted] weeks gestation of pregnancy   2. Nausea/vomiting in pregnancy   3. Diarrhea, unspecified type    Discharge home [facility] Rx Phenergan Imodium OTC prn Return precautions   Allergies as of 01/31/2017   No Known Allergies     Medication List    TAKE these medications   doxylamine (Sleep) 25 MG tablet Commonly known as:  UNISOM Take 12.5-25 mg by mouth 3 (three) times daily. Pt takes one-half tablet in the morning, one-half tablet in the afternoon, and one at bedtime.   JUICE PLUS FIBRE PO Take 6 capsules by mouth daily.   promethazine 25 MG tablet Commonly known as:  PHENERGAN Take 1 tablet (25 mg total) by mouth every 6 (six) hours as needed for nausea or vomiting.   pyridOXINE 100 MG tablet Commonly known as:  VITAMIN B-6 Take 100 mg by mouth at bedtime.      Donette LarryMelanie Hazelyn Kallen, CNM 01/31/2017, 8:38 AM

## 2017-07-16 IMAGING — US US ABDOMEN LIMITED
1 series · 16 of 25 positions shown · non-contrast
Comparison: None

CLINICAL DATA: Epigastric pain. Patient is pregnant. LMP
03/07/2016. [DATE] weeks 3 days.

EXAM:
US ABDOMEN LIMITED - RIGHT UPPER QUADRANT

[Series 2: us abdomen limited · 16 of 50 slices shown]
[im 1/50]
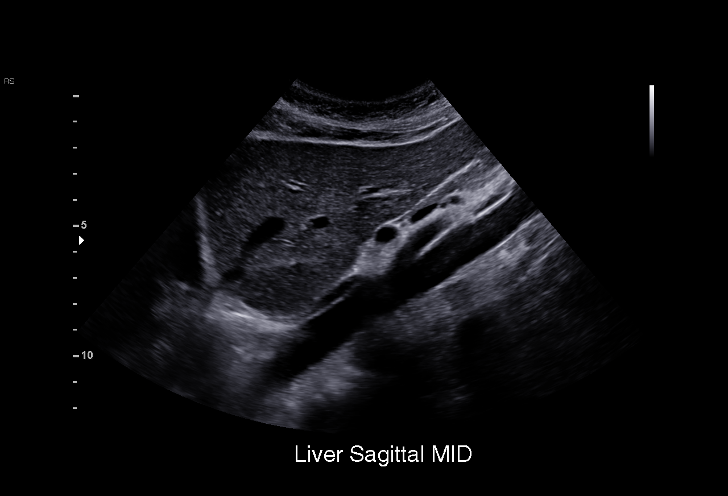
[im 5/50]
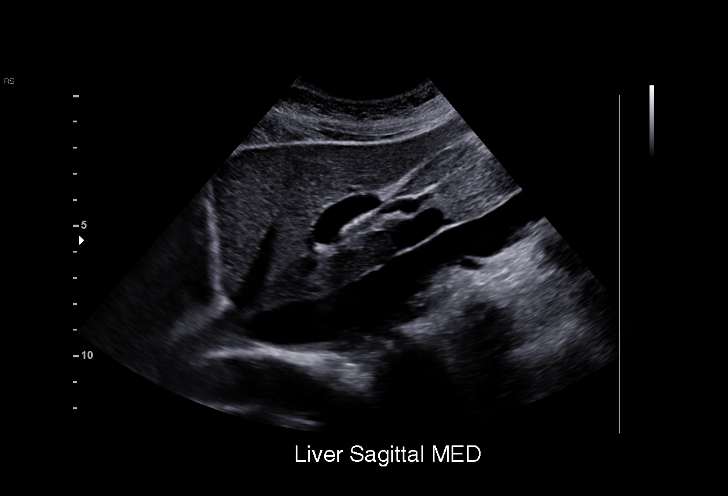
[im 7/50]
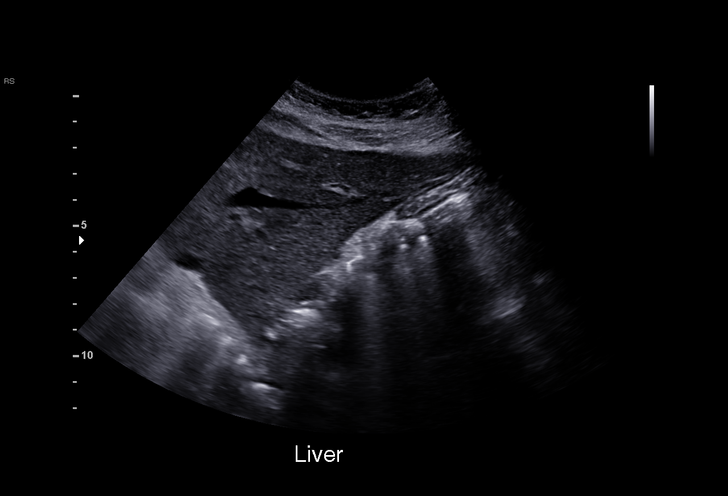
[im 11/50]
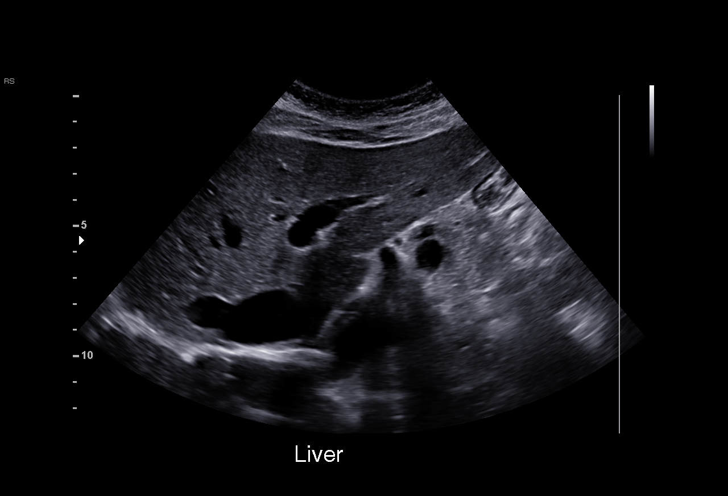
[im 15/50]
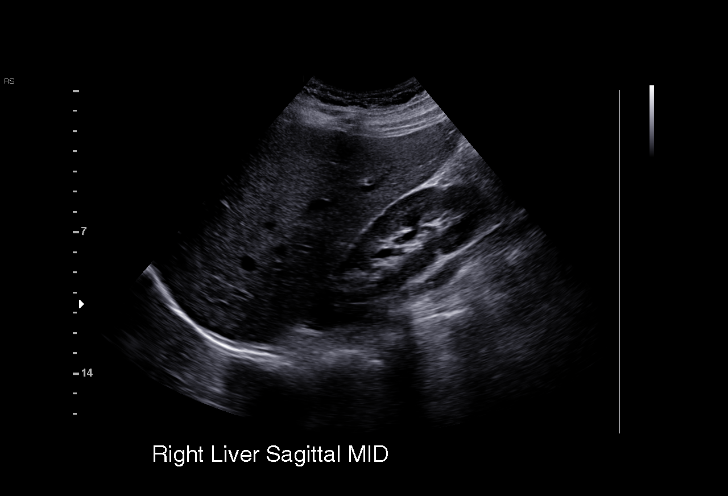
[im 17/50]
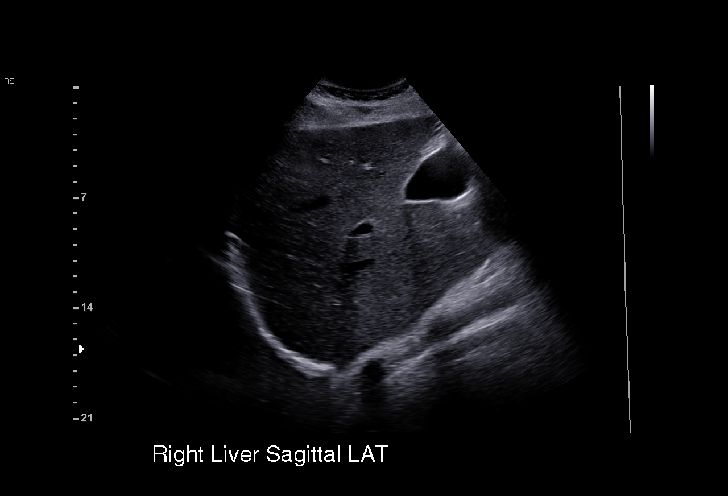
[im 21/50]
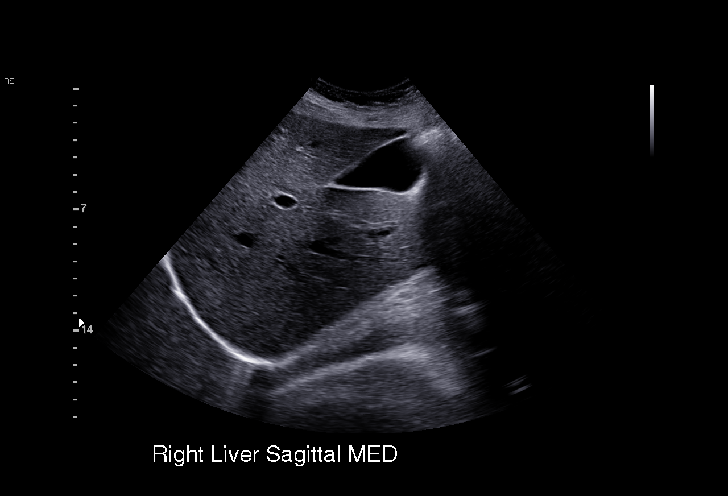
[im 23/50]
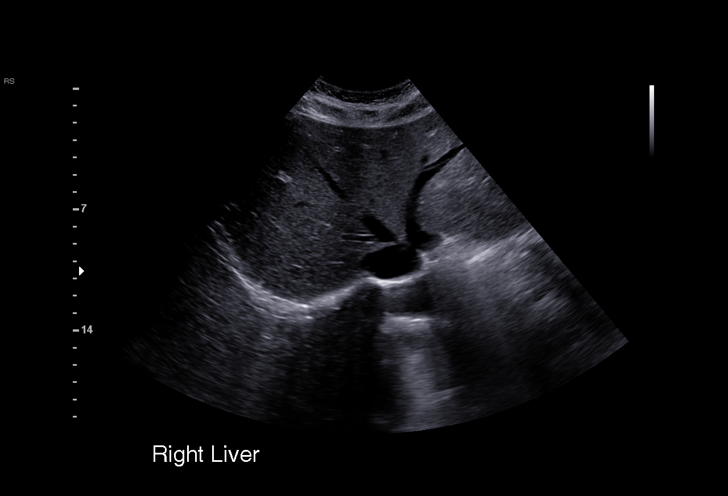
[im 27/50]
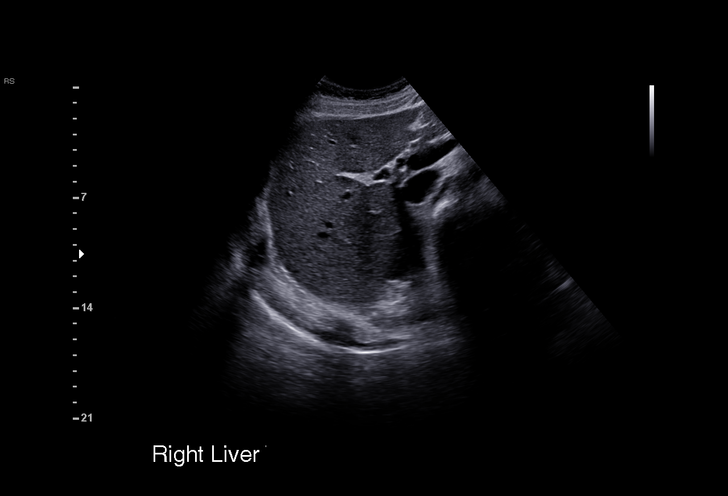
[im 29/50]
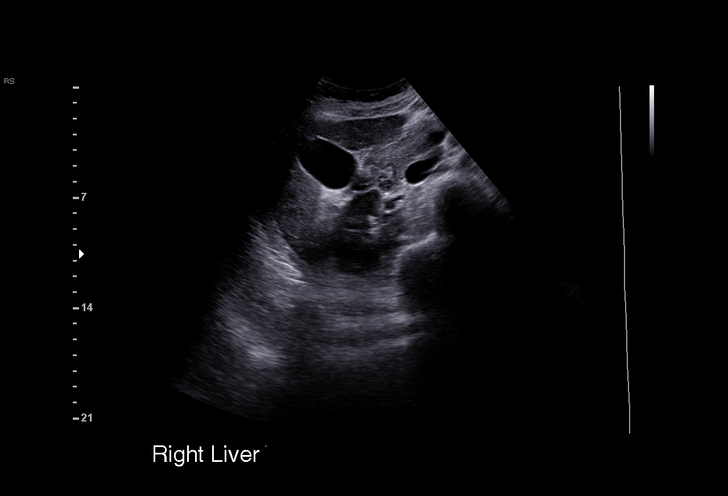
[im 33/50]
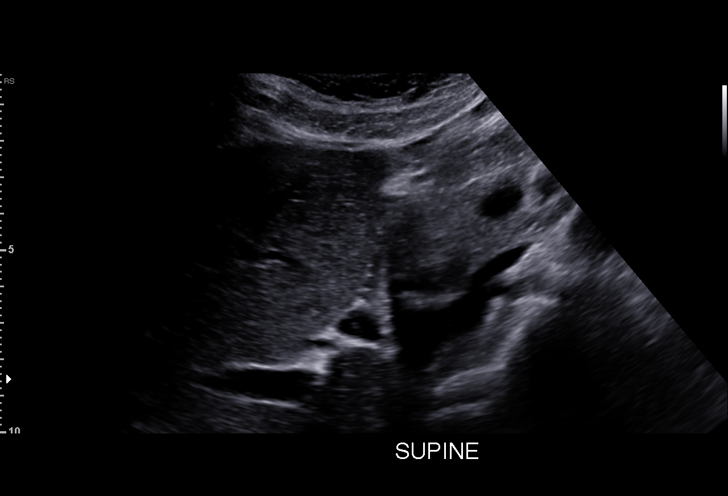
[im 35/50]
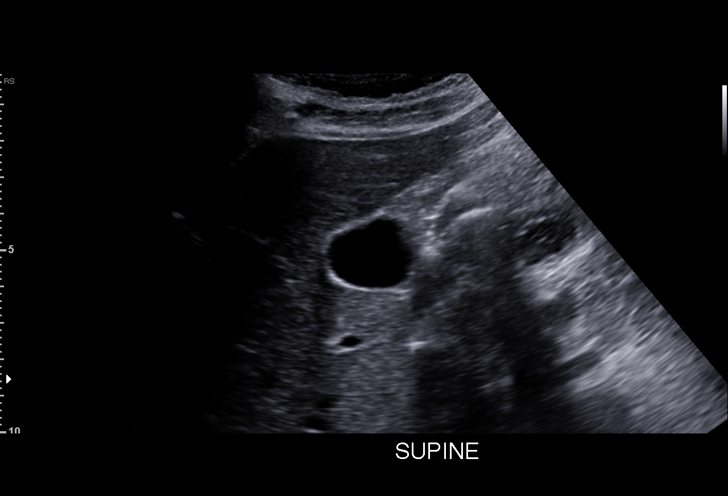
[im 39/50]
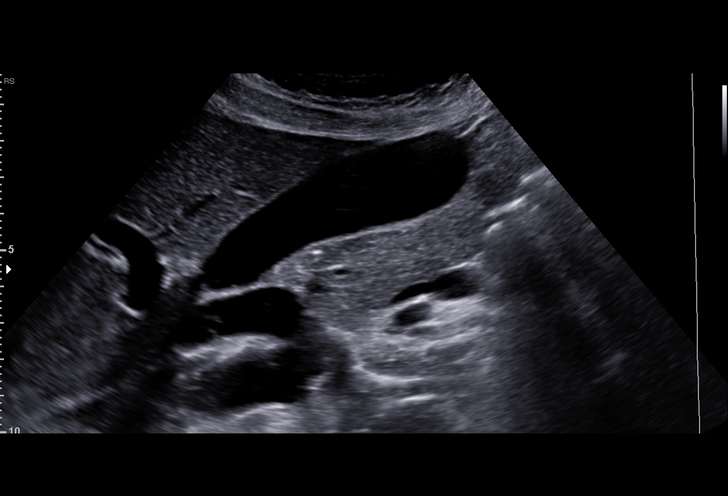
[im 43/50]
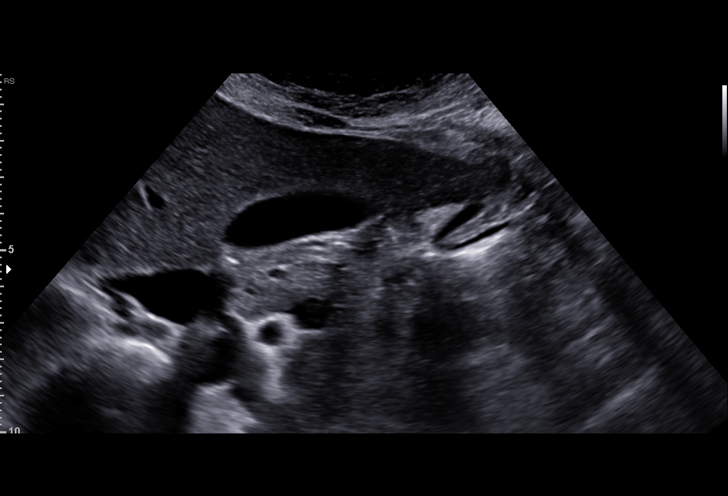
[im 45/50]
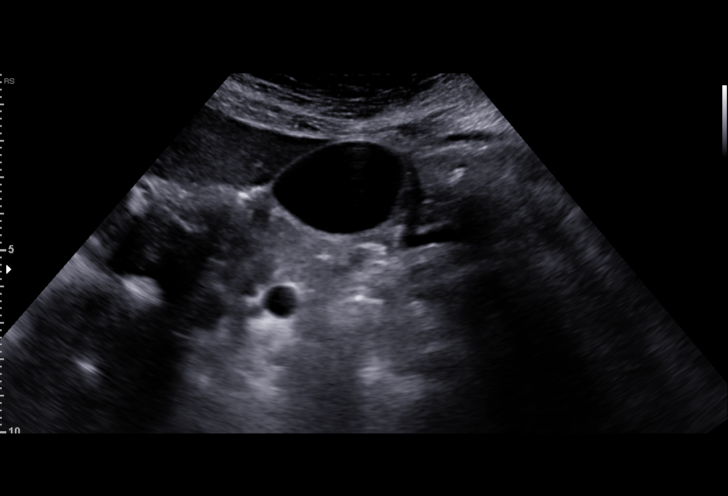
[im 50/50]
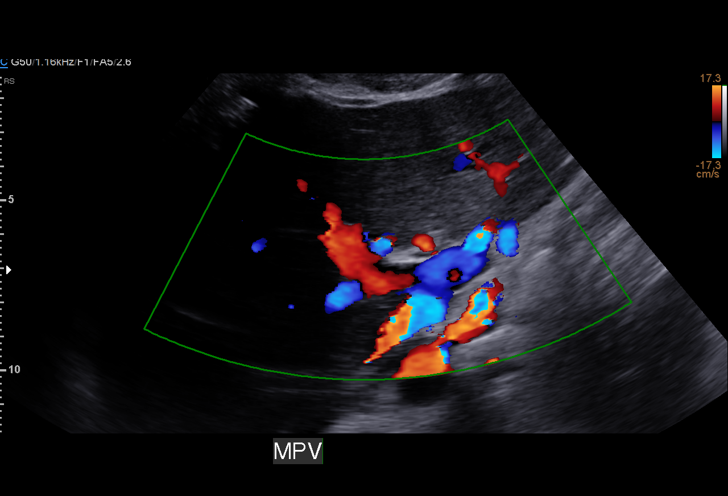

[16 of 25 positions shown; findings below may reference images not displayed]

FINDINGS: Gallbladder:

Insert gallbladder 1.1 mm

Common bile duct:

Diameter: 2.7 mm

Liver:

No focal lesion identified. Within normal limits in parenchymal
echogenicity.
IMPRESSION: Normal evaluation of the right upper quadrant.

## 2017-07-16 IMAGING — US US OB TRANSVAGINAL
1 series · 15 of 28 positions shown · non-contrast
Comparison: None.

CLINICAL DATA: Epigastric painSpotting with abdominal pain

EXAM:
OBSTETRIC <14 WK US AND TRANSVAGINAL OB US
TECHNIQUE: Both transabdominal and transvaginal ultrasound examinations were
performed for complete evaluation of the gestation as well as the
maternal uterus, adnexal regions, and pelvic cul-de-sac.
Transvaginal technique was performed to assess early pregnancy.

[Series 1: us ob transvaginal · 15 of 56 slices shown]
[im 1/56]
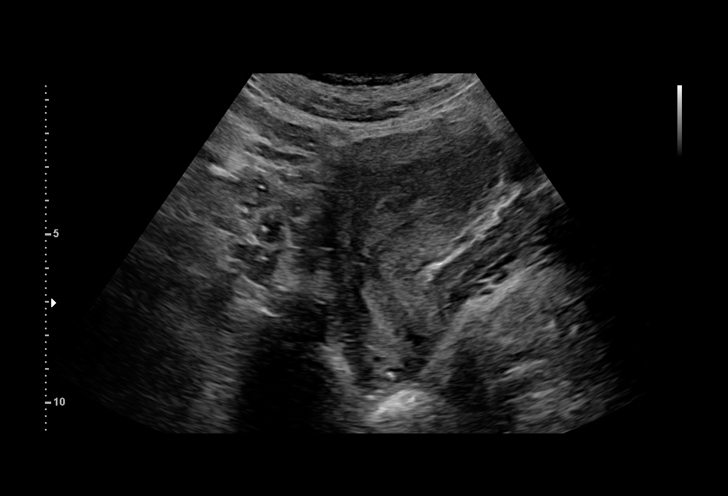
[im 5/56]
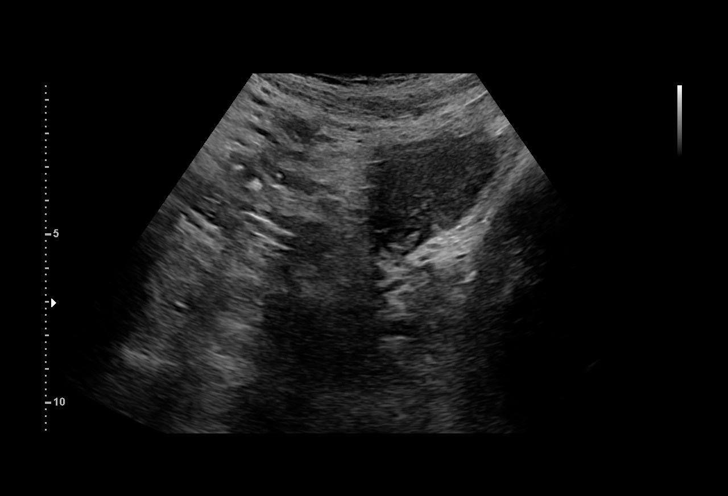
[im 9/56]
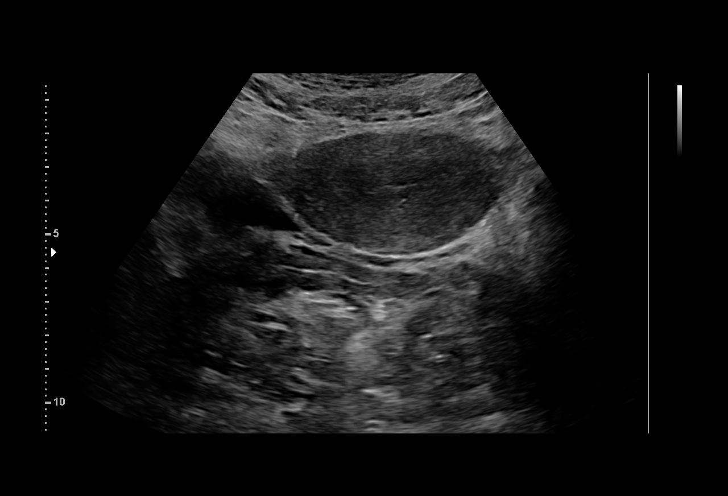
[im 13/56]
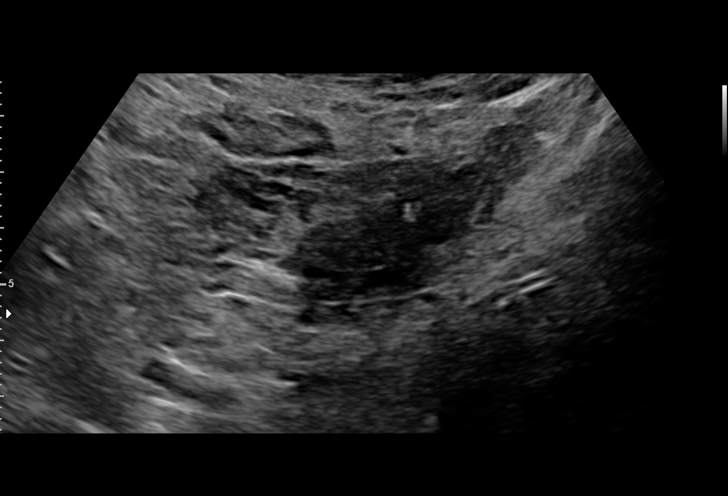
[im 17/56]
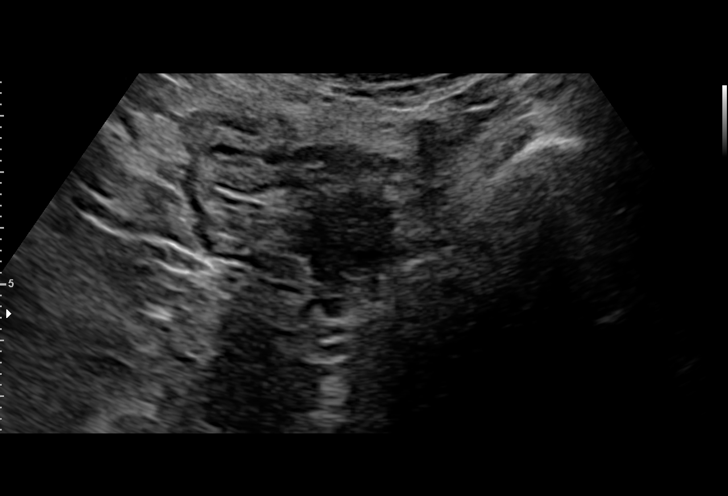
[im 21/56]
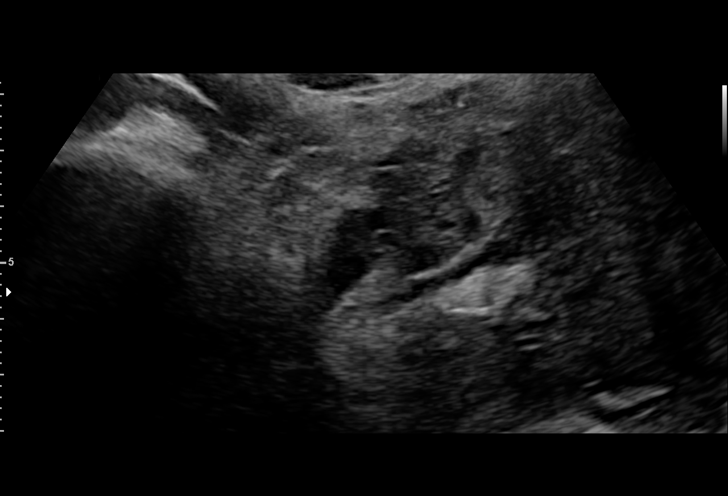
[im 25/56]
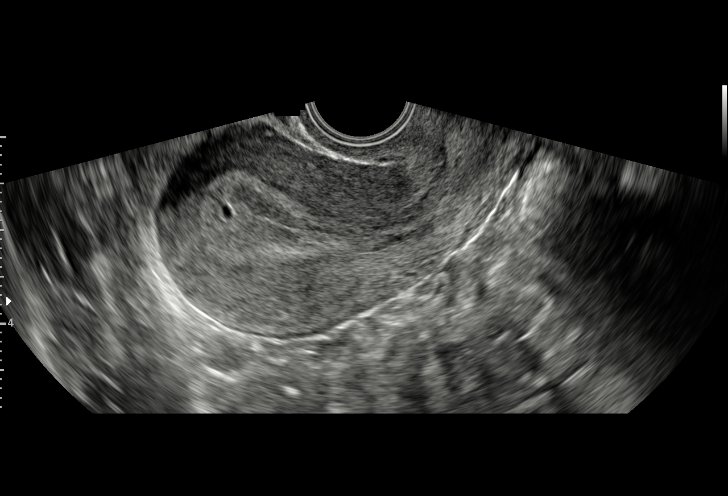
[im 29/56]
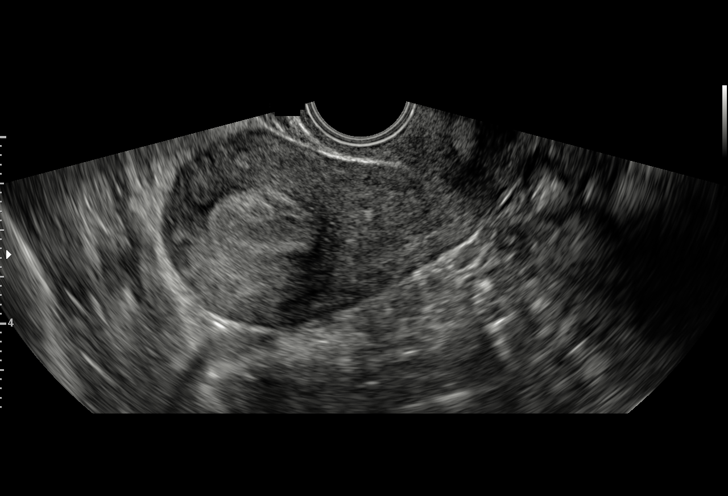
[im 31/56]
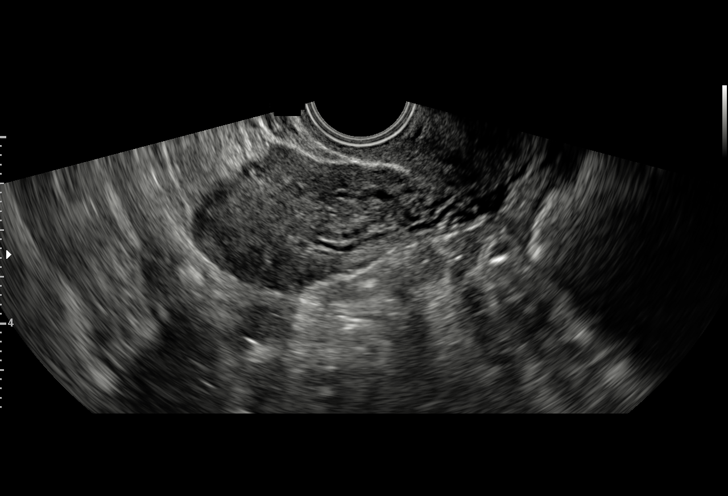
[im 35/56]
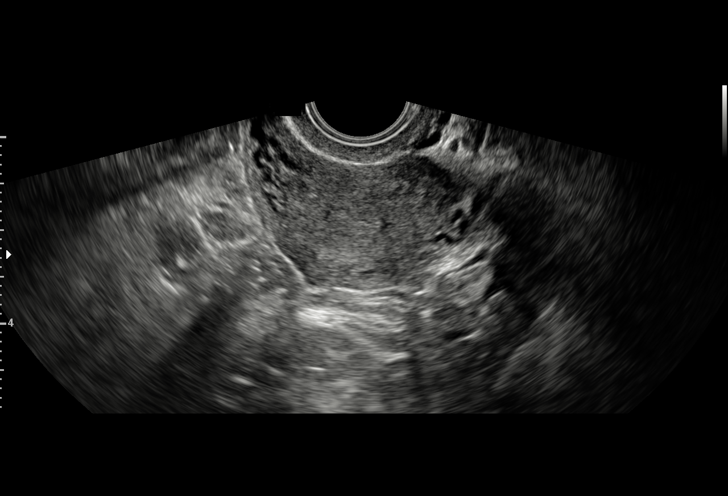
[im 39/56]
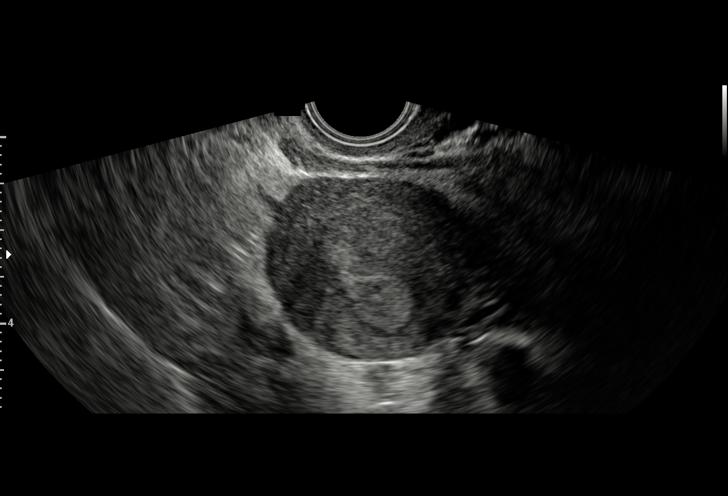
[im 43/56]
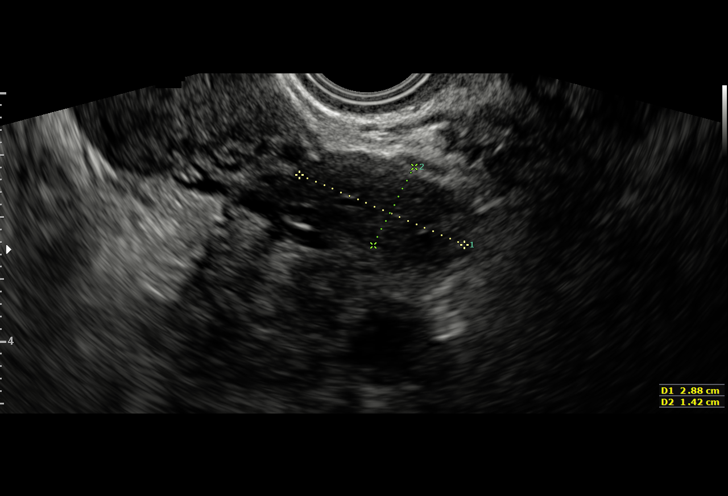
[im 47/56]
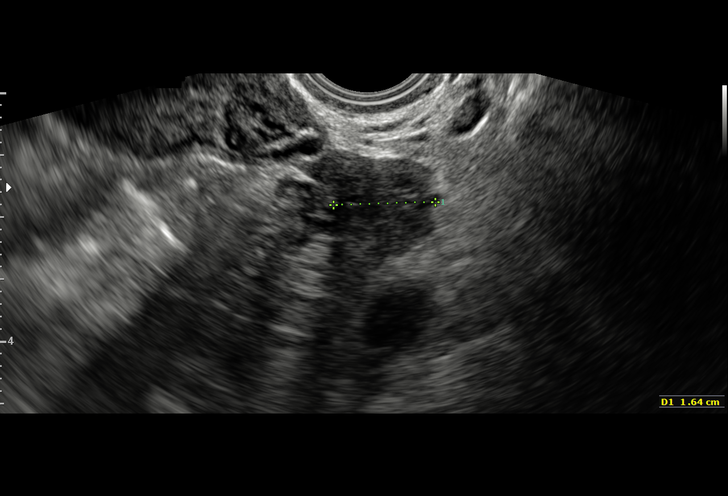
[im 51/56]
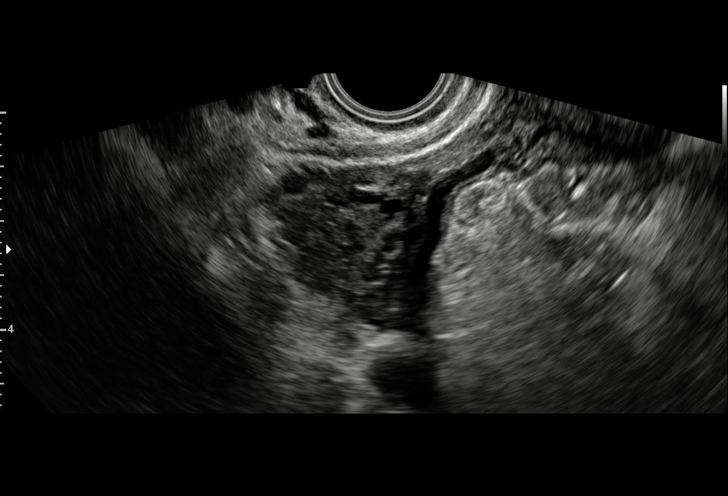
[im 56/56]
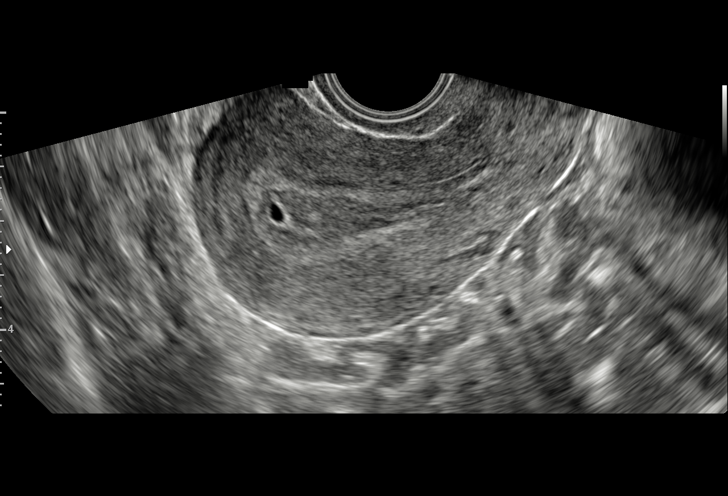

[15 of 28 positions shown; findings below may reference images not displayed]

FINDINGS: Intrauterine gestational sac: A small anechoic fluid collection is
visualized within the thickened endometrium, this likely represents
a small gestational sac.

Yolk sac:  Not visualized

Embryo:  Not visualized

Cardiac Activity: Not visualized

MSD: 3.6  mm   5 w   0  d

Subchorionic hemorrhage:  None visualized.

Maternal uterus/adnexae: The uterus is grossly unremarkable.
Bilateral ovaries are normal in echotexture. The right ovary
measures 1.9 x 2.8 x 1.9 cm. The left ovary measures 1.6 x 2.9 x
cm. No free fluid.
IMPRESSION: Endometrial thickening with small anechoic fluid collection in the
thickened endometrium. Probable early intrauterine gestational sac,
but no yolk sac, fetal pole, or cardiac activity yet visualized.
Recommend follow-up quantitative B-HCG levels and follow-up US in 14
days to confirm and assess viability. This recommendation follows
SRU consensus guidelines: Diagnostic Criteria for Nonviable
Pregnancy Early in the First Trimester. N Engl J Med 1864;

## 2017-07-17 IMAGING — US US OB TRANSVAGINAL
1 series · 15 of 20 positions shown · non-contrast
Comparison: 04/18/2016 obstetric scan.

CLINICAL DATA: 25-year-old pregnant female presents with heavy
bleeding. Tiny intrauterine sac-like structure was seen on obstetric
scan from 1 day prior. Quantitative beta HCG 267 one day prior.

EDC by LMP: 11/29/2016, projecting to an expected gestational age of
8 weeks 0 days
EXAM:
TRANSVAGINAL OB ULTRASOUND
TECHNIQUE: Transvaginal ultrasound was performed for complete evaluation of the
gestation as well as the maternal uterus, adnexal regions, and
pelvic cul-de-sac.

[Series 1: us ob transvaginal · 20 acquisitions, 15 frames shown]
[im 1/20]
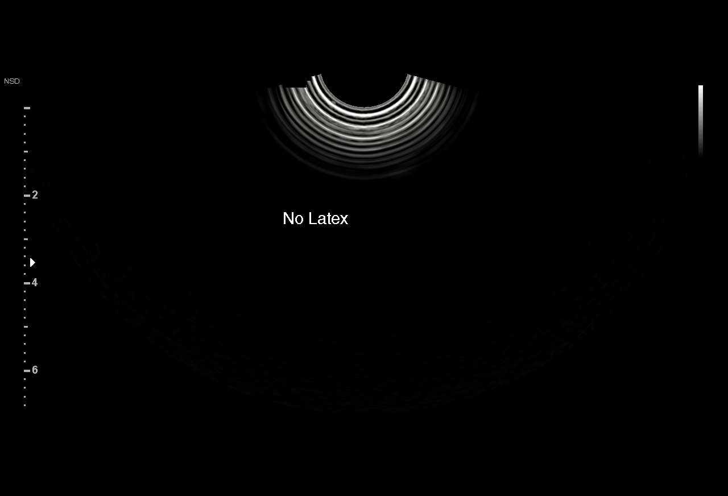
[im 3/20]
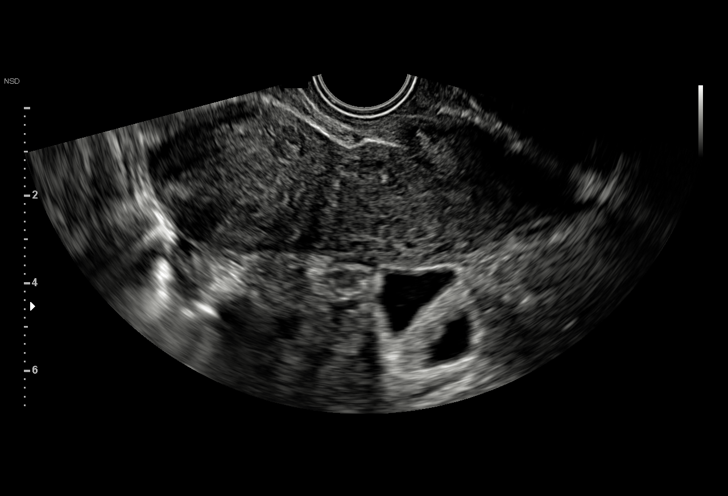
[im 4/20]
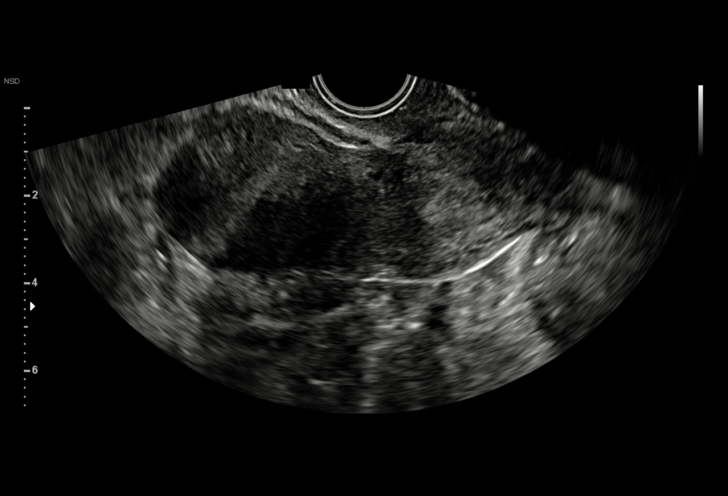
[im 5/20]
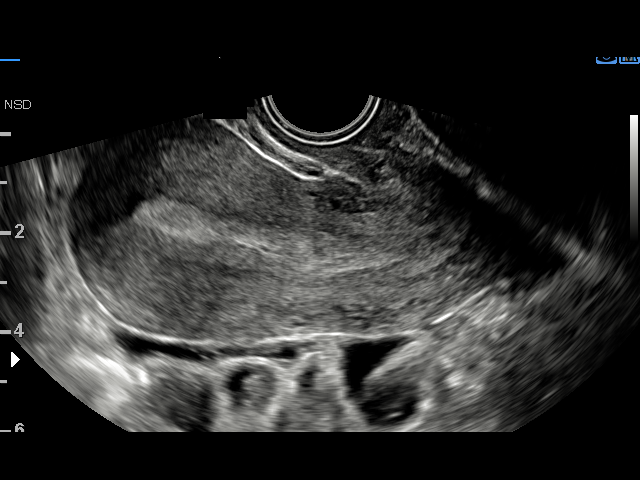
[im 7/20]
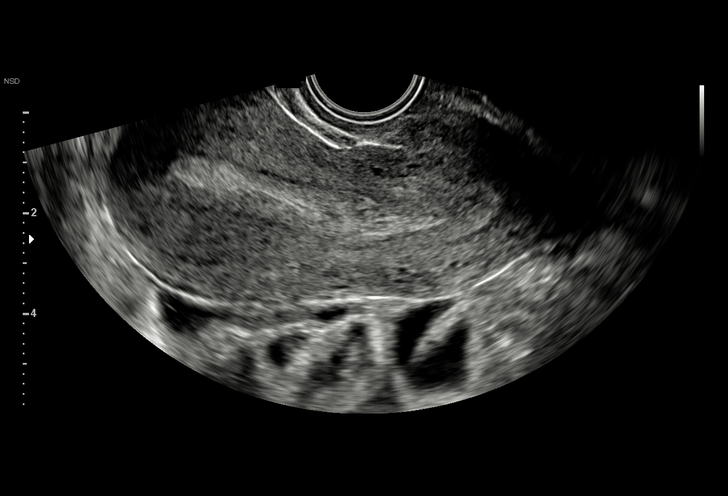
[im 8/20]
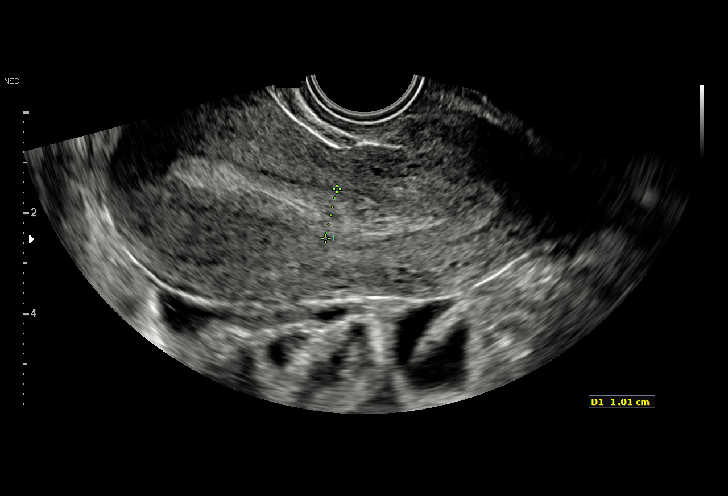
[im 9/20]
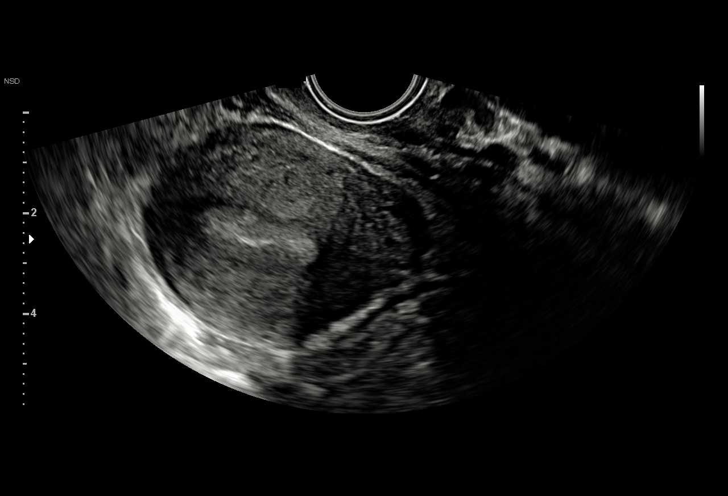
[im 11/20]
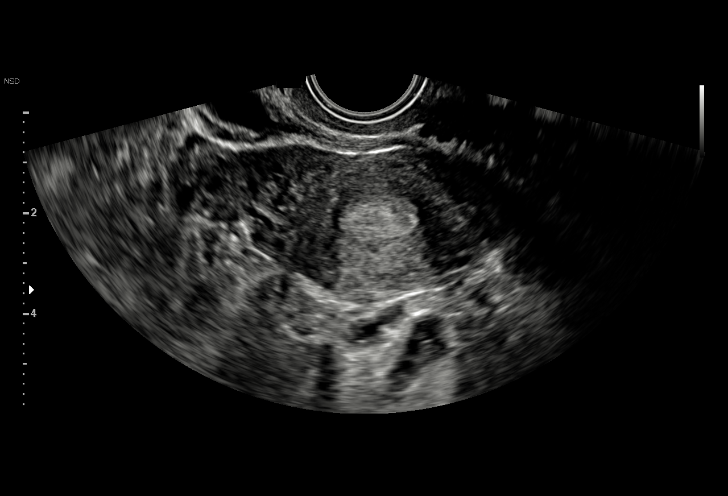
[im 12/20]
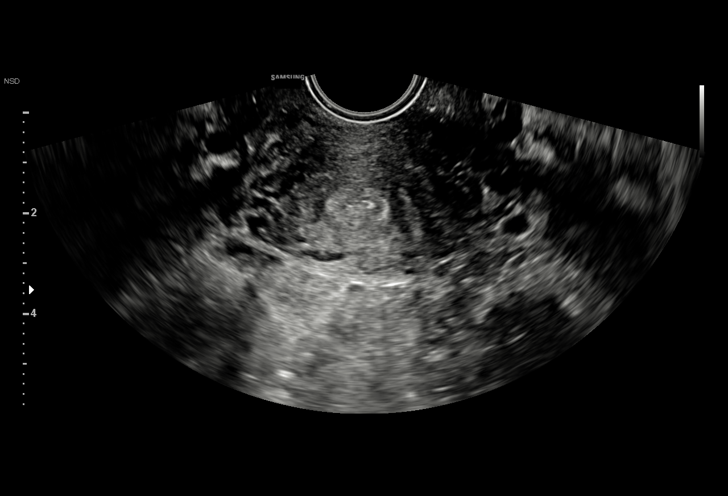
[im 13/20]
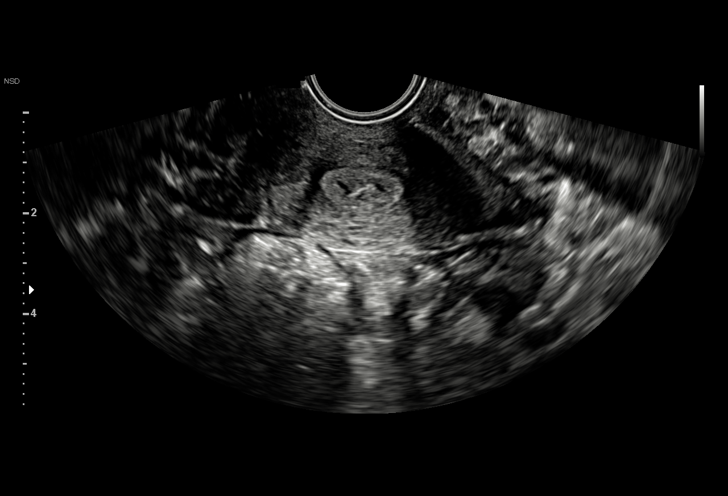
[im 15/20]
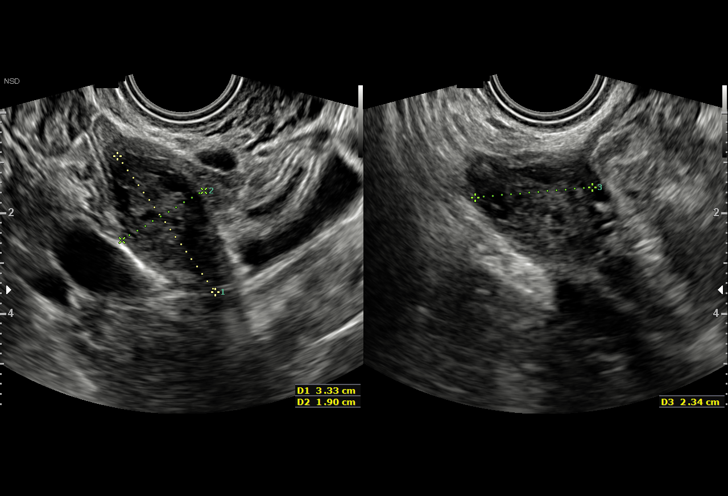
[im 16/20]
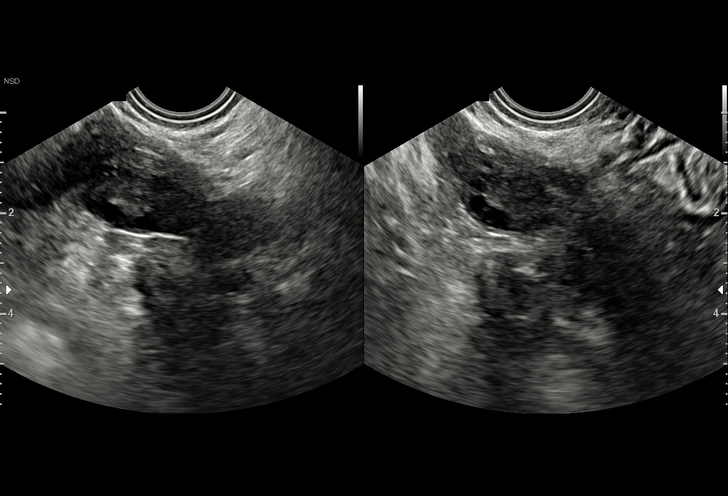
[im 17/20]
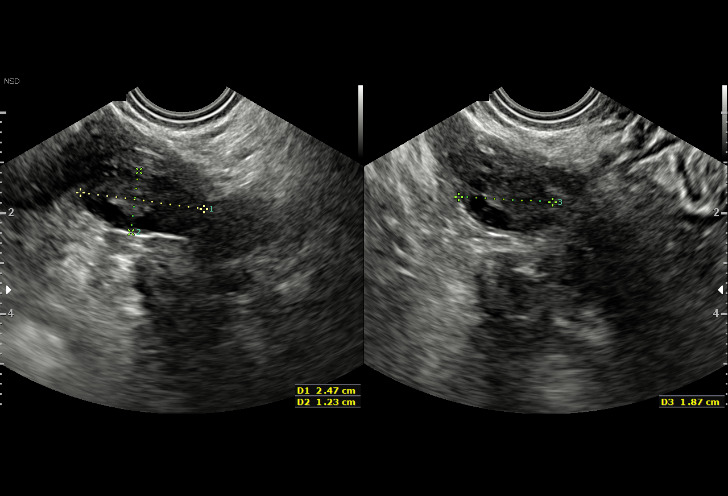
[im 19/20]
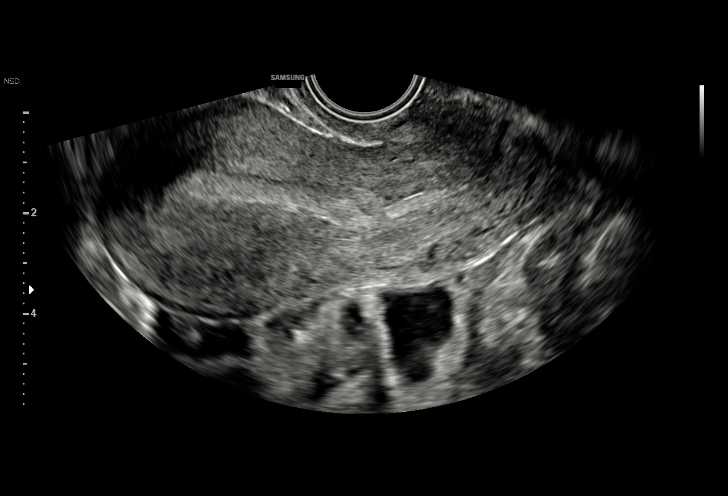
[im 20/20]
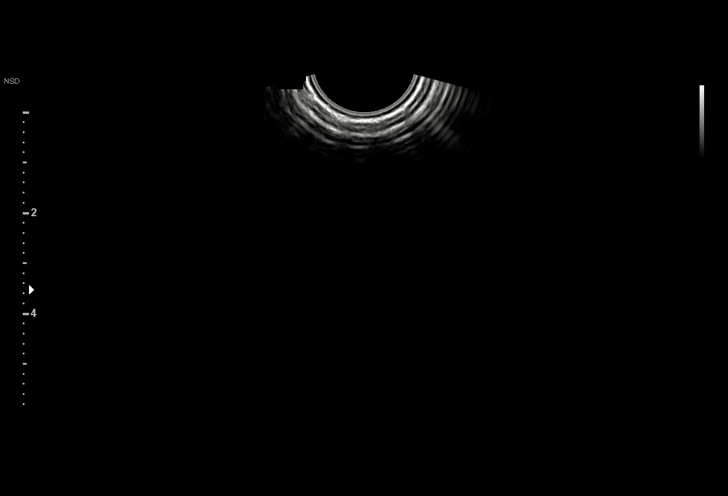

[15 of 20 positions shown; findings below may reference images not displayed]

FINDINGS: Uterus is anteverted and normal in size and configuration. No
uterine fibroids or other myometrial abnormalities. Bilayer
endometrial thickness 10 mm. No intrauterine gestational sac is
demonstrated. The previously demonstrated 5 x 2 x 4 mm sac-like
structure in the fundal endometrium on the 04/18/2016 scan is absent
on today's scan. Endometrium is mildly heterogeneous with no focal
endometrial mass demonstrated.

Right ovary measures 3.3 x 1.9 x 2.3 cm. Left ovary measures 2.5 x
1.2 x 1.9 cm. No suspicious ovarian or adnexal masses. No abnormal
free fluid in the pelvis.
IMPRESSION: No intrauterine gestational sac. Previously demonstrated 5 x 2 x 4
mm sac-like structure in the fundal endometrium on the 04/18/2016
scan is absent on this scan. Mildly heterogeneous endometrium
measuring 10 mm bilayer thickness. No suspicious ovarian or adnexal
masses. Findings are most consistent with a spontaneous abortion in
progress. Given that a definitive pregnancy has not been visualized
by sonography, serial serum beta HCG level monitoring is
recommended, and a follow-up obstetric scan could be obtained if
clinically warranted.

## 2017-11-18 ENCOUNTER — Encounter (HOSPITAL_COMMUNITY): Payer: Self-pay

## 2019-06-23 ENCOUNTER — Encounter (HOSPITAL_COMMUNITY): Payer: Self-pay | Admitting: Obstetrics & Gynecology

## 2019-06-23 ENCOUNTER — Other Ambulatory Visit: Payer: Self-pay

## 2019-06-23 ENCOUNTER — Inpatient Hospital Stay (HOSPITAL_COMMUNITY)
Admission: AD | Admit: 2019-06-23 | Discharge: 2019-06-23 | Disposition: A | Discharge status: Home / Self Care | Payer: Medicaid Other | Attending: Obstetrics & Gynecology | Admitting: Obstetrics & Gynecology

## 2019-06-23 DIAGNOSIS — R300 Dysuria: Secondary | ICD-10-CM | POA: Diagnosis present

## 2019-06-23 DIAGNOSIS — O4703 False labor before 37 completed weeks of gestation, third trimester: Secondary | ICD-10-CM | POA: Diagnosis not present

## 2019-06-23 DIAGNOSIS — Z3A34 34 weeks gestation of pregnancy: Secondary | ICD-10-CM | POA: Diagnosis not present

## 2019-06-23 LAB — URINALYSIS, ROUTINE W REFLEX MICROSCOPIC
Bilirubin Urine: NEGATIVE
Glucose, UA: >=500 mg/dL — AB
Hgb urine dipstick: NEGATIVE
Ketones, ur: 20 mg/dL — AB
Nitrite: NEGATIVE
Protein, ur: NEGATIVE mg/dL
Specific Gravity, Urine: 1.023 (ref 1.005–1.030)
pH: 7 (ref 5.0–8.0)

## 2019-06-23 MED ORDER — NIFEDIPINE 10 MG PO CAPS
10.0000 mg | ORAL_CAPSULE | ORAL | Status: AC | PRN
  Administered 2019-06-23 (×3): 10 mg via ORAL
  Filled 2019-06-23 (×3): qty 1

## 2019-06-23 MED ORDER — LACTATED RINGERS IV BOLUS
1000.0000 mL | Freq: Once | INTRAVENOUS | Status: AC
  Administered 2019-06-23: 1000 mL via INTRAVENOUS

## 2019-06-23 NOTE — MAU Provider Note (Signed)
Chief Complaint:  Urinary Tract Infection   None     HPI: Toni Brown is a 28 y.o. [id number] at [redacted]w[redacted]d by LMP who presents to maternity admissions reporting onset of contractions at 1130 am today that have not resolved.  She reports UTI symptoms yesterday with dysuria and urgency but these have now resolved.  The cramping is low in her abdomen and her low back, intermittent, and staying the same in intensity. She tried magnesium spray and warm bath and is drinking plenty of water. She had rapid labors with her 2 term pregnancies so was concerned when they would not resolve.  She reports good fetal movement.  HPI  Past Medical History: History reviewed. No pertinent past medical history.  Past obstetric history: OB History  Gravida Para Term Preterm AB Living  5 2 2   1 2   SAB TAB Ectopic Multiple Live Births  1     1 2     # Outcome Date GA Lbr Len/2nd Weight Sex Delivery Anes PTL Lv  5A Gravida           5B Current           4 Term 08/24/17 [redacted]w[redacted]d   F Vag-Spont   LIV  3 Term 09/10/14 [redacted]w[redacted]d   M Vag-Spont   LIV  2 SAB 2013          1 Gravida             Past Surgical History: Past Surgical History:  Procedure Laterality Date  . BREAST ENHANCEMENT SURGERY    . CLAVICLE SURGERY    . WISDOM TOOTH EXTRACTION      Family History: No family history on file.  Social History: Social History   Tobacco Use  . Smoking status: Never Smoker  . Smokeless tobacco: Never Used  Substance Use Topics  . Alcohol use: No  . Drug use: No    Allergies: No Known Allergies  Meds:  Medications Prior to Admission  Medication Sig Dispense Refill Last Dose  . pantoprazole (PROTONIX) 20 MG tablet Take 20 mg by mouth daily.     . promethazine (PHENERGAN) 25 MG tablet Take 1 tablet (25 mg total) by mouth every 6 (six) hours as needed for nausea or vomiting. 30 tablet 0 06/23/2019 at Unknown time  . doxylamine, Sleep, (UNISOM) 25 MG tablet Take 12.5-25 mg by mouth 3 (three) times daily. Pt takes  one-half tablet in the morning, one-half tablet in the afternoon, and one at bedtime.     . Nutritional Supplements (JUICE PLUS FIBRE PO) Take 6 capsules by mouth daily.     Marland Kitchen pyridOXINE (VITAMIN B-6) 100 MG tablet Take 100 mg by mouth at bedtime.       ROS:  Review of Systems  Constitutional: Negative for chills, fatigue and fever.  Eyes: Negative for visual disturbance.  Respiratory: Negative for shortness of breath.   Cardiovascular: Negative for chest pain.  Gastrointestinal: Positive for abdominal pain. Negative for nausea and vomiting.  Genitourinary: Negative for difficulty urinating, dysuria, flank pain, pelvic pain, vaginal bleeding, vaginal discharge and vaginal pain.  Musculoskeletal: Positive for back pain.  Neurological: Negative for dizziness and headaches.  Psychiatric/Behavioral: Negative.      I have reviewed patient's Past Medical Hx, Surgical Hx, Family Hx, Social Hx, medications and allergies.   Physical Exam   Patient Vitals for the past 24 hrs:  BP Temp Temp src Pulse Resp SpO2 Height Weight  06/23/19 1830 128/73 -- -- -- -- -- -- --  06/23/19 1719 125/67 -- -- -- -- -- -- --  06/23/19 1649 130/82 -- -- -- -- -- -- --  06/23/19 1627 123/78 -- -- -- -- -- -- --  06/23/19 1444 137/80 98.2 F (36.8 C) Oral (!) 102 18 99 % 5\' 10"  (1.778 m) 116.6 kg   Constitutional: Well-developed, well-nourished female in no acute distress.  Cardiovascular: normal rate Respiratory: normal effort GI: Abd soft, non-tender, gravid appropriate for gestational age.  MS: Extremities nontender, no edema, normal ROM Neurologic: Alert and oriented x 4.  GU: Neg CVAT.   Dilation: 1 Effacement (%): 40 Station: Ballotable Presentation: Vertex Exam by:: [email or web address] CNM  FHT:  Baseline 145 , moderate variability, accelerations present, no decelerations Contractions: q 3-5 mins, mild to palpation   Labs: Results for orders placed or performed during the hospital  encounter of 06/23/19 (from the past 24 hour(s))  Urinalysis, Routine w reflex microscopic     Status: Abnormal   Collection Time: 06/23/19  2:48 PM  Result Value Ref Range   Color, Urine YELLOW YELLOW   APPearance CLEAR CLEAR   Specific Gravity, Urine 1.023 1.005 - 1.030   pH 7.0 5.0 - 8.0   Glucose, UA >=500 (A) NEGATIVE mg/dL   Hgb urine dipstick NEGATIVE NEGATIVE   Bilirubin Urine NEGATIVE NEGATIVE   Ketones, ur 20 (A) NEGATIVE mg/dL   Protein, ur NEGATIVE NEGATIVE mg/dL   Nitrite NEGATIVE NEGATIVE   Leukocytes,Ua SMALL (A) NEGATIVE   RBC / HPF 0-5 0 - 5 RBC/hpf   WBC, UA 0-5 0 - 5 WBC/hpf   Bacteria, UA RARE (A) NONE SEEN   Squamous Epithelial / LPF 11-20 0 - 5   Mucus PRESENT       Imaging:  No results found.  MAU Course/MDM: Orders Placed This Encounter  Procedures  . OB Urine Culture  . Urinalysis, Routine w reflex microscopic  . Discharge patient    Meds ordered this encounter  Medications  . lactated ringers bolus 1,000 mL  . NIFEdipine (PROCARDIA) capsule 10 mg     NST reviewed and reactive Cervix 1/40/ballotable, likely baseline but will recheck in 2 hours IV fluids and procardia 10 mg Q 20 min x 3 doses PRN Contractions slightly less frequent after fluids and medications but remain Q 6 minutes per pt, toco tracing intermittently Offered terbutaline, pt declined No cervical change in 2+ hours in MAU D/C home with preterm labor contractions F/U with your prenatal providers in [place] Return to MAU as needed for emergencies  Pt discharge with strict preterm labor precautions.    Assessment: 1. Threatened preterm labor, third trimester     Plan: Discharge home Labor precautions and fetal kick counts Follow-up Information    [facility] Follow up.   Why: As scheduled or sooner if symptoms persist. Return to MAU with signs of preterm labor or emergencies.         Allergies as of 06/23/2019   No Known Allergies      Medication List    TAKE these medications   doxylamine (Sleep) 25 MG tablet Commonly known as: UNISOM Take 12.5-25 mg by mouth 3 (three) times daily. Pt takes one-half tablet in the morning, one-half tablet in the afternoon, and one at bedtime.   JUICE PLUS FIBRE PO Take 6 capsules by mouth daily.   pantoprazole 20 MG tablet Commonly known as: PROTONIX Take 20 mg by mouth daily.   promethazine 25 MG tablet Commonly known as: PHENERGAN Take 1  tablet (25 mg total) by mouth every 6 (six) hours as needed for nausea or vomiting.   pyridOXINE 100 MG tablet Commonly known as: VITAMIN B-6 Take 100 mg by mouth at bedtime.       Sharen CounterLisa Leftwich-Kirby Certified Nurse-Midwife 06/23/2019 6:40 PM

## 2019-06-23 NOTE — MAU Note (Signed)
Started having like UTI symptoms yesterday. (frequency, some burning with urination) Today started having contractions that haven't let up. No bleeding or leaking.

## 2019-06-23 NOTE — MAU Note (Signed)
Pt gets care at [facility] in [place]. Previous deliveries - 1 in [facility]; 1 at home.

## 2019-06-24 HISTORY — PX: BREAST SURGERY: SHX581

## 2019-06-24 LAB — CULTURE, OB URINE: Culture: NO GROWTH

## 2023-10-27 ENCOUNTER — Inpatient Hospital Stay (HOSPITAL_COMMUNITY)
Admission: AD | Admit: 2023-10-27 | Discharge: 2023-10-27 | Disposition: A | Discharge status: Home / Self Care | Attending: Obstetrics & Gynecology | Admitting: Obstetrics & Gynecology

## 2023-10-27 ENCOUNTER — Encounter (HOSPITAL_COMMUNITY): Payer: Self-pay | Admitting: Obstetrics & Gynecology

## 2023-10-27 DIAGNOSIS — Z6791 Unspecified blood type, Rh negative: Secondary | ICD-10-CM

## 2023-10-27 DIAGNOSIS — Z3A22 22 weeks gestation of pregnancy: Secondary | ICD-10-CM | POA: Insufficient documentation

## 2023-10-27 DIAGNOSIS — O26892 Other specified pregnancy related conditions, second trimester: Secondary | ICD-10-CM | POA: Diagnosis present

## 2023-10-27 DIAGNOSIS — R109 Unspecified abdominal pain: Secondary | ICD-10-CM | POA: Diagnosis present

## 2023-10-27 DIAGNOSIS — R10A1 Flank pain, right side: Secondary | ICD-10-CM | POA: Insufficient documentation

## 2023-10-27 DIAGNOSIS — O26899 Other specified pregnancy related conditions, unspecified trimester: Secondary | ICD-10-CM

## 2023-10-27 LAB — CBC
HCT: 35.3 % — ABNORMAL LOW (ref 36.0–46.0)
Hemoglobin: 11.8 g/dL — ABNORMAL LOW (ref 12.0–15.0)
MCH: 27.7 pg (ref 26.0–34.0)
MCHC: 33.4 g/dL (ref 30.0–36.0)
MCV: 82.9 fL (ref 80.0–100.0)
Platelets: 167 10*3/uL (ref 150–400)
RBC: 4.26 MIL/uL (ref 3.87–5.11)
RDW: 14.1 % (ref 11.5–15.5)
WBC: 9.8 10*3/uL (ref 4.0–10.5)
nRBC: 0 % (ref 0.0–0.2)

## 2023-10-27 LAB — COMPREHENSIVE METABOLIC PANEL WITH GFR
ALT: 13 U/L (ref 0–44)
AST: 14 U/L — ABNORMAL LOW (ref 15–41)
Albumin: 3 g/dL — ABNORMAL LOW (ref 3.5–5.0)
Alkaline Phosphatase: 62 U/L (ref 38–126)
Anion gap: 8 (ref 5–15)
BUN: 10 mg/dL (ref 6–20)
CO2: 23 mmol/L (ref 22–32)
Calcium: 9.1 mg/dL (ref 8.9–10.3)
Chloride: 103 mmol/L (ref 98–111)
Creatinine, Ser: 0.52 mg/dL (ref 0.44–1.00)
GFR, Estimated: >60 mL/min (ref >60)
Glucose, Bld: 90 mg/dL (ref 70–99)
Potassium: 4 mmol/L (ref 3.5–5.1)
Sodium: 134 mmol/L — ABNORMAL LOW (ref 135–145)
Total Bilirubin: 0.8 mg/dL (ref 0.0–1.2)
Total Protein: 6.4 g/dL — ABNORMAL LOW (ref 6.5–8.1)

## 2023-10-27 LAB — URINALYSIS, ROUTINE W REFLEX MICROSCOPIC
Bilirubin Urine: NEGATIVE
Glucose, UA: 50 mg/dL — AB
Hgb urine dipstick: NEGATIVE
Ketones, ur: NEGATIVE mg/dL
Leukocytes,Ua: NEGATIVE
Nitrite: NEGATIVE
Protein, ur: NEGATIVE mg/dL
Specific Gravity, Urine: 1.016 (ref 1.005–1.030)
pH: 6 (ref 5.0–8.0)

## 2023-10-27 NOTE — MAU Provider Note (Signed)
  History     CSN: [id number]  Arrival date and time: 10/27/23 1025   Event Date/Time   First Provider Initiated Contact with Patient 10/27/23 1117      No chief complaint on file.  HPI  33 yo [phone] @ 22+2 here with concern for her kidneys. Has some right flank pain present for a couple of days. No fever or chills, no dysuria or hematuria or change smell or color, but does have frequency. Has had HEG this pregnancy but this past week tolerating PO. Concerned there may be a problem with her kidney   History reviewed. No pertinent past medical history.  Past Surgical History:  Procedure Laterality Date   BREAST ENHANCEMENT SURGERY     BREAST SURGERY  2021   CLAVICLE SURGERY     WISDOM TOOTH EXTRACTION      History reviewed. No pertinent family history.  Social History   Tobacco Use   Smoking status: Never   Smokeless tobacco: Never  Substance Use Topics   Alcohol use: No   Drug use: No    Allergies: No Known Allergies  Medications Prior to Admission  Medication Sig Dispense Refill Last Dose/Taking   metoCLOPramide (REGLAN) 5 MG tablet Take 5 mg by mouth 4 (four) times daily.   10/27/2023 at  6:00 AM   ondansetron (ZOFRAN) 8 MG tablet Take 8 mg by mouth every 8 (eight) hours as needed for nausea or vomiting.   10/27/2023   pantoprazole  (PROTONIX ) 20 MG tablet Take 20 mg by mouth daily.   10/27/2023   scopolamine (TRANSDERM-SCOP) 1 MG/3DAYS Place 1 patch onto the skin every 3 (three) days.   10/27/2023 at  6:00 AM   doxylamine, Sleep, (UNISOM) 25 MG tablet Take 12.5-25 mg by mouth 3 (three) times daily. Pt takes one-half tablet in the morning, one-half tablet in the afternoon, and one at bedtime.      Nutritional Supplements (JUICE PLUS FIBRE PO) Take 6 capsules by mouth daily.      promethazine  (PHENERGAN ) 25 MG tablet Take 1 tablet (25 mg total) by mouth every 6 (six) hours as needed for nausea or vomiting. 30 tablet 0    pyridOXINE (VITAMIN B-6) 100 MG tablet Take 100 mg by  mouth at bedtime.       Review of Systems Physical Exam   Blood pressure 120/77, pulse 89, temperature 98 F (36.7 C), temperature source Oral, resp. rate 18, height 5\' 10"  (1.778 m), weight 95.1 kg, SpO2 98%, unknown if currently breastfeeding.  Physical Exam NAD RRR Abdomen gravid, soft No flank tenderness  FHTs: 150s   Assessment and Plan   # Right flank pain Likely msk. No constitutional signs/symptoms, no signs of infection on urinalysis, normal kidney function without leukocytosis on labs, well appearing. Tolerating PO, no electrolyte or other kidney function derangement on cmp. Reassured patient this is likely musculoskeletal. Return precautions reviewed.  Toni Brown Toni Brown 10/27/2023, 1:47 PM

## 2023-10-27 NOTE — MAU Note (Signed)
 MAU Triage Note:  .Toni Brown is a 33 y.o. at Unknown here in MAU reporting: nausea for the past 5 weeks with no emesis for the past week. She also started having right flank pain 2 days ago that feels dull and aching. Denies VB or LOF. Reports +FM.   Takes Scopolamine, Reglan q6, and zofran q8. She does have relief with these medications.   Receives Care at [facility] in [place]. Patient complaint: right kidney pain in side and back  Pain Score: 6  Pain Location: Flank     Onset of complaint: On-going LMP: No LMP recorded. Patient is pregnant.  Vitals:   10/27/23 1041  BP: 120/77  Pulse: 89  Resp: 18  Temp: 98 F (36.7 C)  SpO2: 98%    FHT:  Fetal Heart Rate Mode: Doppler Baseline Rate (A): 154 bpm Multiple birth?: No Lab orders placed from triage: UA

## 2023-10-29 LAB — CULTURE, OB URINE: Culture: NO GROWTH
